# Patient Record
Sex: Male | Born: 1940 | Race: Black or African American | Hispanic: No | Smoking: Former smoker
Health system: Southern US, Community
[De-identification: ages and names within clinical notes are randomized; demographics above are authoritative.]

## PROBLEM LIST (undated history)

## (undated) DIAGNOSIS — E785 Hyperlipidemia, unspecified: Secondary | ICD-10-CM

## (undated) HISTORY — PX: TRANSURETHRAL RESECTION OF PROSTATE: SHX73

---

## 2000-04-28 ENCOUNTER — Inpatient Hospital Stay (HOSPITAL_COMMUNITY): Admission: EM | Admit: 2000-04-28 | Discharge: 2000-04-29 | Payer: Self-pay | Admitting: Cardiology

## 2016-05-22 ENCOUNTER — Emergency Department (HOSPITAL_COMMUNITY): Payer: Medicare FFS

## 2016-05-22 ENCOUNTER — Emergency Department (HOSPITAL_COMMUNITY)
Admission: EM | Admit: 2016-05-22 | Discharge: 2016-05-22 | Disposition: A | Discharge status: Home / Self Care | Payer: Medicare FFS | Attending: Emergency Medicine | Admitting: Emergency Medicine

## 2016-05-22 ENCOUNTER — Encounter (HOSPITAL_COMMUNITY): Payer: Self-pay | Admitting: *Deleted

## 2016-05-22 DIAGNOSIS — R319 Hematuria, unspecified: Secondary | ICD-10-CM

## 2016-05-22 DIAGNOSIS — Z7982 Long term (current) use of aspirin: Secondary | ICD-10-CM | POA: Insufficient documentation

## 2016-05-22 DIAGNOSIS — N3001 Acute cystitis with hematuria: Secondary | ICD-10-CM | POA: Diagnosis not present

## 2016-05-22 DIAGNOSIS — Z87891 Personal history of nicotine dependence: Secondary | ICD-10-CM | POA: Insufficient documentation

## 2016-05-22 DIAGNOSIS — N3289 Other specified disorders of bladder: Secondary | ICD-10-CM | POA: Diagnosis not present

## 2016-05-22 DIAGNOSIS — R300 Dysuria: Secondary | ICD-10-CM | POA: Diagnosis present

## 2016-05-22 HISTORY — DX: Hyperlipidemia, unspecified: E78.5

## 2016-05-22 LAB — URINALYSIS, ROUTINE W REFLEX MICROSCOPIC
BILIRUBIN URINE: NEGATIVE
GLUCOSE, UA: NEGATIVE mg/dL
Ketones, ur: NEGATIVE mg/dL
Leukocytes, UA: NEGATIVE
Nitrite: NEGATIVE
PROTEIN: 100 mg/dL — AB
Specific Gravity, Urine: 1.025 (ref 1.005–1.030)
pH: 6 (ref 5.0–8.0)

## 2016-05-22 LAB — URINE MICROSCOPIC-ADD ON

## 2016-05-22 MED ORDER — LIDOCAINE HCL (PF) 1 % IJ SOLN
INTRAMUSCULAR | Status: AC
  Filled 2016-05-22: qty 5

## 2016-05-22 MED ORDER — CEFTRIAXONE SODIUM 1 G IJ SOLR
1.0000 g | Freq: Once | INTRAMUSCULAR | Status: AC
  Administered 2016-05-22: 1 g via INTRAMUSCULAR
  Filled 2016-05-22: qty 10

## 2016-05-22 MED ORDER — CEPHALEXIN 500 MG PO CAPS: 500.0000 mg | ORAL_CAPSULE | Freq: Four times a day (QID) | ORAL | 0 refills | Status: DC

## 2016-05-22 NOTE — ED Notes (Signed)
Instructed pt to take all of antibiotics as prescribed. 

## 2016-05-22 NOTE — Discharge Instructions (Signed)
Take the prescription as directed.  Call your regular Urologist on Monday to schedule a follow up appointment within the next 3 days.  Return to the Emergency Department immediately sooner if worsening.  ° °

## 2016-05-22 NOTE — ED Notes (Signed)
Bambi called from CT and stated that it would be about 45 minutes before CT could be done.  Will inform pt

## 2016-05-22 NOTE — ED Triage Notes (Signed)
Pt comes in with penile bleeding that started yesterday. Pt states he is passing clots at times. He states to urinate is painful, denies any malodorous urine.

## 2016-05-22 NOTE — ED Notes (Signed)
Gross hematuria noted.

## 2016-05-22 NOTE — ED Provider Notes (Signed)
[facility] Provider Note   CSN: [id number] Arrival date & time: 05/22/16  0955     History   Chief Complaint Chief Complaint  Patient presents with  . Hematuria  . Dysuria    HPI Carlos Mccann is a 75 y.o. male.  HPI  Pt was seen at 1020.  Per pt, c/o gradual onset and persistence of constant dysuria 2 days. Has been associated with hematuria "with clots" since yesterday.  Denies flank pain, no fevers, no abd pain, no N/V/D, no testicular pain/swelling, no rash.   Past Medical History:  Diagnosis Date  . Hyperlipemia     There are no active problems to display for this patient.   Past Surgical History:  Procedure Laterality Date  . TRANSURETHRAL RESECTION OF PROSTATE         Home Medications    Prior to Admission medications   Medication Sig Start Date End Date Taking? Authorizing Provider  aspirin EC 81 MG tablet Take 81 mg by mouth daily.   Yes Historical Provider, MD    Family History No family history on file.  Social History Social History  Substance Use Topics  . Smoking status: Former Games developer  . Smokeless tobacco: Never Used  . Alcohol use No     Allergies   Review of patient's allergies indicates no known allergies.   Review of Systems Review of Systems ROS: Statement: All systems negative except as marked or noted in the HPI; Constitutional: Negative for fever and chills. ; ; Eyes: Negative for eye pain, redness and discharge. ; ; ENMT: Negative for ear pain, hoarseness, nasal congestion, sinus pressure and sore throat. ; ; Cardiovascular: Negative for chest pain, palpitations, diaphoresis, dyspnea and peripheral edema. ; ; Respiratory: Negative for cough, wheezing and stridor. ; ; Gastrointestinal: Negative for nausea, vomiting, diarrhea, abdominal pain, blood in stool, hematemesis, jaundice and rectal bleeding. . ; ; Genitourinary: Negative for flank pain and +dysuria, +hematuria. ; ; Genital:  No penile drainage or rash, no  testicular pain or swelling, no scrotal rash or swelling. ;; Musculoskeletal: Negative for back pain and neck pain. Negative for swelling and trauma.; ; Skin: Negative for pruritus, rash, abrasions, blisters, bruising and skin lesion.; ; Neuro: Negative for headache, lightheadedness and neck stiffness. Negative for weakness, altered level of consciousness, altered mental status, extremity weakness, paresthesias, involuntary movement, seizure and syncope.       Physical Exam Updated Vital Signs BP (!) 140/107 (BP Location: Right Arm)   Pulse 78   Temp 98.1 F (36.7 C) (Oral)   Resp 16   Ht 5\' 8"  (1.727 m)   Wt 158 lb (71.7 kg)   SpO2 100%   BMI 24.02 kg/m   Physical Exam 1025: Physical examination:  Nursing notes reviewed; Vital signs and O2 SAT reviewed;  Constitutional: Well developed, Well nourished, Well hydrated, In no acute distress; Head:  Normocephalic, atraumatic; Eyes: EOMI, PERRL, No scleral icterus; ENMT: Mouth and pharynx normal, Mucous membranes moist; Neck: Supple, Full range of motion, No lymphadenopathy; Cardiovascular: Regular rate and rhythm, No gallop; Respiratory: Breath sounds clear & equal bilaterally, No wheezes.  Speaking full sentences with ease, Normal respiratory effort/excursion; Chest: Nontender, Movement normal; Abdomen: Soft, Nontender, Nondistended, Normal bowel sounds; Genitourinary: No CVA tenderness; Extremities: Pulses normal, No tenderness, No edema, No calf edema or asymmetry.; Neuro: AA&Ox3, Major CN grossly intact.  Speech clear. No gross focal motor or sensory deficits in extremities.; Skin: Color normal, Warm, Dry.   ED Treatments / Results  Labs (all labs ordered are listed, but only abnormal results are displayed)   EKG  EKG Interpretation None       Radiology   Procedures Procedures (including critical care time)  Medications Ordered in ED Medications - No data to display   Initial Impression / Assessment and Plan / ED Course    I have reviewed the triage vital signs and the nursing notes.  Pertinent labs & imaging results that were available during my care of the patient were reviewed by me and considered in my medical decision making (see chart for details).  MDM Reviewed: previous chart, nursing note and vitals Interpretation: labs and CT scan   Results for orders placed or performed during the hospital encounter of 05/22/16  Urinalysis, Routine w reflex microscopic ([facility])  Result Value Ref Range   Color, Urine RED (A) YELLOW   APPearance TURBID (A) CLEAR   Specific Gravity, Urine 1.025 1.005 - 1.030   pH 6.0 5.0 - 8.0   Glucose, UA NEGATIVE NEGATIVE mg/dL   Hgb urine dipstick LARGE (A) NEGATIVE   Bilirubin Urine NEGATIVE NEGATIVE   Ketones, ur NEGATIVE NEGATIVE mg/dL   Protein, ur 409100 (A) NEGATIVE mg/dL   Nitrite NEGATIVE NEGATIVE   Leukocytes, UA NEGATIVE NEGATIVE  Urine microscopic-add on  Result Value Ref Range   Squamous Epithelial / LPF 0-5 (A) NONE SEEN   WBC, UA 6-30 0 - 5 WBC/hpf   RBC / HPF TOO NUMEROUS TO COUNT 0 - 5 RBC/hpf   Bacteria, UA FEW (A) NONE SEEN   Urine-Other URINALYSIS PERFORMED ON SUPERNATANT    Ct Renal Stone Study Result Date: 05/22/2016 CLINICAL DATA:  Peanuts bleeding starting yesterday EXAM: CT ABDOMEN AND PELVIS WITHOUT CONTRAST TECHNIQUE: Multidetector CT imaging of the abdomen and pelvis was performed following the standard protocol without IV contrast. COMPARISON:  None. FINDINGS: Lower chest:  The lung bases are unremarkable.  Small hiatal hernia. Hepatobiliary: Unenhanced liver shows no biliary ductal dilatation. No calcified gallstones are noted within gallbladder. Pancreas: Unenhanced pancreas is unremarkable. Spleen: Unenhanced spleen is unremarkable. Adrenals/Urinary Tract: Unenhanced kidneys shows no nephrolithiasis. There is a probable cyst midpole of the right kidney anteriorly measures 8 mm. Probable cyst in lower pole of the left kidney measures 1.5 cm.  This cannot be characterized without IV contrast. No hydronephrosis or hydroureter. No calcified ureteral calculi. There is mild thickening of anterior wall of urinary bladder. Mild cystitis cannot be excluded. Stomach/Bowel: No small bowel obstruction. No thickened or dilated small bowel loops. Moderate stool noted in right colon and transverse colon. There is no pericecal inflammation. Normal appendix. Some colonic stool noted in sigmoid colon and rectum. No distal colitis or diverticulitis. Vascular/Lymphatic: No aortic aneurysm. Minimal atherosclerotic calcifications are noted proximal iliac arteries. Reproductive: There is significant enlarged prostate gland with indentation of urinary bladder base. Prostate gland measures at least 6.6 by 5.7 cm. Correlation with urology exam is recommended. Other: No ascites or free abdominal air.  No inguinal adenopathy. Musculoskeletal: Sagittal images of the spine shows degenerative changes lumbar spine. Significant disc space flattening with vacuum disc phenomenon and endplate sclerotic changes with mild anterior and mild posterior spurring at L5-S1 level. IMPRESSION: 1. There is no nephrolithiasis. Question bilateral renal cysts. No calcified ureteral calculi. No hydronephrosis or hydroureter. 2. No small bowel or colonic obstruction. 3. Degenerative changes lumbar spine. 4. Significant enlarged prostate gland with indentation of urinary bladder base. Further correlation with urology exam is recommended. 5. Mild thickening of anterior  wall of urinary bladder. Mild cystitis cannot be excluded. Clinical correlation is necessary. 6. Moderate stool noted within distal sigmoid colon and rectum. No evidence of colitis or diverticulitis. Electronically Signed   By: Natasha MeadLiviu  Pop M.D.   On: 05/22/2016 12:57    1300:  +UTI, UC pending. Will dose IM rocephin while in the ED, rx keflex. Pt wants to go home now. Dx and testing d/w pt.  Questions answered.  Verb understanding,  agreeable to d/c home with outpt f/u.    Final Clinical Impressions(s) / ED Diagnoses   Final diagnoses:  Hematuria    New Prescriptions New Prescriptions   No medications on file     Samuel JesterKathleen Alley Neils, DO 05/25/16 1505

## 2016-05-24 LAB — URINE CULTURE: Culture: NO GROWTH

## 2016-06-06 ENCOUNTER — Encounter (HOSPITAL_COMMUNITY): Payer: Self-pay | Admitting: *Deleted

## 2016-06-06 ENCOUNTER — Emergency Department (HOSPITAL_COMMUNITY)
Admission: EM | Admit: 2016-06-06 | Discharge: 2016-06-06 | Disposition: A | Discharge status: Home / Self Care | Payer: Medicare FFS | Attending: Emergency Medicine | Admitting: Emergency Medicine

## 2016-06-06 DIAGNOSIS — M25511 Pain in right shoulder: Secondary | ICD-10-CM | POA: Insufficient documentation

## 2016-06-06 DIAGNOSIS — Z7982 Long term (current) use of aspirin: Secondary | ICD-10-CM | POA: Insufficient documentation

## 2016-06-06 DIAGNOSIS — Z79899 Other long term (current) drug therapy: Secondary | ICD-10-CM | POA: Insufficient documentation

## 2016-06-06 DIAGNOSIS — M25512 Pain in left shoulder: Secondary | ICD-10-CM | POA: Diagnosis not present

## 2016-06-06 DIAGNOSIS — Z87891 Personal history of nicotine dependence: Secondary | ICD-10-CM | POA: Insufficient documentation

## 2016-06-06 MED ORDER — KETOROLAC TROMETHAMINE 60 MG/2ML IM SOLN
20.0000 mg | Freq: Once | INTRAMUSCULAR | Status: AC
  Administered 2016-06-06: 20 mg via INTRAMUSCULAR
  Filled 2016-06-06: qty 2

## 2016-06-06 MED ORDER — METHOCARBAMOL 500 MG PO TABS
500.0000 mg | ORAL_TABLET | Freq: Once | ORAL | Status: AC
  Administered 2016-06-06: 500 mg via ORAL
  Filled 2016-06-06: qty 1

## 2016-06-06 MED ORDER — METHOCARBAMOL 500 MG PO TABS: 500.0000 mg | ORAL_TABLET | Freq: Two times a day (BID) | ORAL | 0 refills | Status: DC | PRN

## 2016-06-06 NOTE — ED Triage Notes (Signed)
Pt c/o left shoulder pain that woke him up from sleep, states that he may have injured the shoulder when he was helping his wife, denies any sob, n/v.

## 2016-06-06 NOTE — ED Provider Notes (Signed)
[facility] Provider Note   CSN: [id number] Arrival date & time: 06/06/16  0154     History   Chief Complaint Chief Complaint  Patient presents with  . Shoulder Pain    HPI Carlos Mccann is a 75 y.o. male.   Shoulder Pain   This is a recurrent problem. The current episode started 6 to 12 hours ago. The problem occurs constantly. The problem has been gradually improving. The pain is present in the neck, right shoulder and left shoulder. The quality of the pain is described as aching. The pain is at a severity of 5/10. The pain is moderate. Associated symptoms include stiffness and tingling. Pertinent negatives include no numbness and full range of motion. Exacerbated by: movement. He has tried nothing for the symptoms. The treatment provided no relief. There has been no history of extremity trauma.    Past Medical History:  Diagnosis Date  . Hyperlipemia     There are no active problems to display for this patient.   Past Surgical History:  Procedure Laterality Date  . TRANSURETHRAL RESECTION OF PROSTATE         Home Medications    Prior to Admission medications   Medication Sig Start Date End Date Taking? Authorizing Provider  aspirin EC 81 MG tablet Take 81 mg by mouth daily.   Yes Historical Provider, MD  cephALEXin (KEFLEX) 500 MG capsule Take 1 capsule (500 mg total) by mouth 4 (four) times daily. 05/22/16  Yes Samuel Jester, DO  simvastatin (ZOCOR) 40 MG tablet Take 40 mg by mouth daily.   Yes Historical Provider, MD  methocarbamol (ROBAXIN) 500 MG tablet Take 1 tablet (500 mg total) by mouth every 12 (twelve) hours as needed for muscle spasms. 06/06/16   Marily Memos, MD    Family History No family history on file.  Social History Social History  Substance Use Topics  . Smoking status: Former Games developer  . Smokeless tobacco: Never Used  . Alcohol use No     Allergies   Review of patient's allergies indicates no known allergies.   Review of  Systems Review of Systems  Musculoskeletal: Positive for neck pain (and bilateral shoulder pain) and stiffness.  Neurological: Positive for tingling. Negative for numbness.  All other systems reviewed and are negative.    Physical Exam Updated Vital Signs BP 148/94   Pulse (!) 58   Temp 97.9 F (36.6 C) (Oral)   Resp 12   Ht 5\' 8"  (1.727 m)   Wt 165 lb (74.8 kg)   SpO2 96%   BMI 25.09 kg/m   Physical Exam  Constitutional: He is oriented to person, place, and time. He appears well-developed and well-nourished.  HENT:  Head: Normocephalic and atraumatic.  Eyes: Conjunctivae are normal.  Neck: Neck supple.  Cardiovascular: Normal rate and regular rhythm.   No murmur heard. Pulmonary/Chest: Effort normal and breath sounds normal. No respiratory distress.  Abdominal: Soft. There is no tenderness.  Musculoskeletal: He exhibits tenderness (and spasms to trapezius bilaterally). He exhibits no edema.  Neurological: He is alert and oriented to person, place, and time.  Skin: Skin is warm and dry.  Psychiatric: He has a normal mood and affect.  Nursing note and vitals reviewed.    ED Treatments / Results  Labs (all labs ordered are listed, but only abnormal results are displayed) Labs Reviewed - No data to display  EKG  EKG Interpretation  Date/Time:  Sunday June 06 2016 02:28:05 EDT Ventricular Rate:  74 PR Interval:    QRS Duration: 84 QT Interval:  391 QTC Calculation: 434 R Axis:   46 Text Interpretation:  Sinus rhythm Borderline T wave abnormalities Baseline wander in lead(s) V6 No old tracing to compare Confirmed by [facility] MD, Barbara CowerJASON [phone]([id number]) on 06/06/2016 2:32:19 AM       Radiology No results found.  Procedures Procedures (including critical care time)  Medications Ordered in ED Medications  ketorolac (TORADOL) injection 20 mg (20 mg Intramuscular Given 06/06/16 0229)  methocarbamol (ROBAXIN) tablet 500 mg (500 mg Oral Given 06/06/16 0229)     Initial  Impression / Assessment and Plan / ED Course  I have reviewed the triage vital signs and the nursing notes.  Pertinent labs & imaging results that were available during my care of the patient were reviewed by me and considered in my medical decision making (see chart for details).  Clinical Course    Likely MSK, ecg ok, doubt ACS. Symptoms improved with toradol and robaxin. Will dc on nsaid and robaxin at home.   Final Clinical Impressions(s) / ED Diagnoses   Final diagnoses:  Bilateral shoulder pain    New Prescriptions Discharge Medication List as of 06/06/2016  3:43 AM    START taking these medications   Details  methocarbamol (ROBAXIN) 500 MG tablet Take 1 tablet (500 mg total) by mouth every 12 (twelve) hours as needed for muscle spasms., Starting Sun 06/06/2016, Print         Marily MemosJason Ethlyn Alto, MD 06/06/16 [phone]

## 2017-01-01 ENCOUNTER — Emergency Department (HOSPITAL_COMMUNITY): Payer: Medicare FFS

## 2017-01-01 ENCOUNTER — Emergency Department (HOSPITAL_COMMUNITY)
Admission: EM | Admit: 2017-01-01 | Discharge: 2017-01-01 | Disposition: A | Discharge status: Home / Self Care | Payer: Medicare FFS | Attending: Emergency Medicine | Admitting: Emergency Medicine

## 2017-01-01 ENCOUNTER — Encounter (HOSPITAL_COMMUNITY): Payer: Self-pay | Admitting: *Deleted

## 2017-01-01 DIAGNOSIS — R319 Hematuria, unspecified: Secondary | ICD-10-CM

## 2017-01-01 DIAGNOSIS — Z7982 Long term (current) use of aspirin: Secondary | ICD-10-CM | POA: Insufficient documentation

## 2017-01-01 DIAGNOSIS — Z87891 Personal history of nicotine dependence: Secondary | ICD-10-CM | POA: Diagnosis not present

## 2017-01-01 DIAGNOSIS — Z79899 Other long term (current) drug therapy: Secondary | ICD-10-CM | POA: Diagnosis not present

## 2017-01-01 DIAGNOSIS — N400 Enlarged prostate without lower urinary tract symptoms: Secondary | ICD-10-CM

## 2017-01-01 DIAGNOSIS — N401 Enlarged prostate with lower urinary tract symptoms: Secondary | ICD-10-CM | POA: Diagnosis not present

## 2017-01-01 DIAGNOSIS — N390 Urinary tract infection, site not specified: Secondary | ICD-10-CM

## 2017-01-01 LAB — CBC WITH DIFFERENTIAL/PLATELET
BASOS ABS: 0 10*3/uL (ref 0.0–0.1)
Basophils Relative: 1 %
Eosinophils Absolute: 0.3 10*3/uL (ref 0.0–0.7)
Eosinophils Relative: 7 %
HEMATOCRIT: 41.4 % (ref 39.0–52.0)
Hemoglobin: 14.2 g/dL (ref 13.0–17.0)
LYMPHS PCT: 42 %
Lymphs Abs: 1.6 10*3/uL (ref 0.7–4.0)
MCH: 29 pg (ref 26.0–34.0)
MCHC: 34.3 g/dL (ref 30.0–36.0)
MCV: 84.5 fL (ref 78.0–100.0)
MONO ABS: 0.3 10*3/uL (ref 0.1–1.0)
Monocytes Relative: 9 %
NEUTROS ABS: 1.6 10*3/uL — AB (ref 1.7–7.7)
Neutrophils Relative %: 41 %
Platelets: 177 10*3/uL (ref 150–400)
RBC: 4.9 MIL/uL (ref 4.22–5.81)
RDW: 13.5 % (ref 11.5–15.5)
WBC: 3.8 10*3/uL — AB (ref 4.0–10.5)

## 2017-01-01 LAB — COMPREHENSIVE METABOLIC PANEL
ALBUMIN: 4.2 g/dL (ref 3.5–5.0)
ALT: 18 U/L (ref 17–63)
AST: 25 U/L (ref 15–41)
Alkaline Phosphatase: 51 U/L (ref 38–126)
Anion gap: 6 (ref 5–15)
BILIRUBIN TOTAL: 0.6 mg/dL (ref 0.3–1.2)
BUN: 16 mg/dL (ref 6–20)
CO2: 28 mmol/L (ref 22–32)
Calcium: 9.4 mg/dL (ref 8.9–10.3)
Chloride: 103 mmol/L (ref 101–111)
Creatinine, Ser: 1.06 mg/dL (ref 0.61–1.24)
GFR calc Af Amer: >60 mL/min (ref >60)
GFR calc non Af Amer: >60 mL/min (ref >60)
GLUCOSE: 126 mg/dL — AB (ref 65–99)
Potassium: 3.6 mmol/L (ref 3.5–5.1)
Sodium: 137 mmol/L (ref 135–145)
TOTAL PROTEIN: 7.8 g/dL (ref 6.5–8.1)

## 2017-01-01 LAB — URINALYSIS, ROUTINE W REFLEX MICROSCOPIC

## 2017-01-01 LAB — URINALYSIS, MICROSCOPIC (REFLEX)

## 2017-01-01 MED ORDER — CEPHALEXIN 500 MG PO CAPS: 500.0000 mg | ORAL_CAPSULE | Freq: Four times a day (QID) | ORAL | 0 refills | Status: DC

## 2017-01-01 MED ORDER — DEXTROSE 5 % IV SOLN
1.0000 g | Freq: Once | INTRAVENOUS | Status: AC
  Administered 2017-01-01: 1 g via INTRAVENOUS
  Filled 2017-01-01: qty 10

## 2017-01-01 MED ORDER — IOPAMIDOL (ISOVUE-300) INJECTION 61%
100.0000 mL | Freq: Once | INTRAVENOUS | Status: AC | PRN
  Administered 2017-01-01: 100 mL via INTRAVENOUS

## 2017-01-01 MED ORDER — SODIUM CHLORIDE 0.9 % IV BOLUS (SEPSIS)
1000.0000 mL | Freq: Once | INTRAVENOUS | Status: AC
  Administered 2017-01-01: 1000 mL via INTRAVENOUS

## 2017-01-01 NOTE — Discharge Instructions (Signed)
You have a urinary infection which is also causing bleeding. Additionally, you have an enlarged prostate. You must see a urologist.  This is very important. Phone number given. Start prescription for antibiotic in the morning. Increase fluids.

## 2017-01-01 NOTE — ED Triage Notes (Addendum)
Pt c/o blood in his urine, dysuria, urinary frequency x 1.5 days. Denies fever, flank pain. Pt reports hx of same with diagnosis of UTI.

## 2017-01-01 NOTE — ED Provider Notes (Signed)
[facility] Provider Note   CSN: [id number] Arrival date & time: 01/01/17  1005   By signing my name below, I, Clarisse Gouge, attest that this documentation has been prepared under the direction and in the presence of Donnetta Hutching, MD. Electronically signed, Clarisse Gouge, ED Scribe. 01/01/17. 2:56 PM.  History   Chief Complaint Chief Complaint  Patient presents with  . Hematuria   HPI  HPI Comments: Carlos Mccann is a 76 y.o. male who presents to the Emergency Department complaining of recurrent hematuria that has flared up x 2 days. Hx of "TURP 6-7 years ago". He notes associated occasional flank pain and dysuria. Pt denies appetite change, weight change, fever, chills and Hx of cancer.  Past Medical History:  Diagnosis Date  . Hyperlipemia     There are no active problems to display for this patient.   Past Surgical History:  Procedure Laterality Date  . TRANSURETHRAL RESECTION OF PROSTATE         Home Medications    Prior to Admission medications   Medication Sig Start Date End Date Taking? Authorizing Provider  aspirin EC 81 MG tablet Take 81 mg by mouth daily.   Yes Historical Provider, MD  simvastatin (ZOCOR) 40 MG tablet Take 40 mg by mouth daily.   Yes Historical Provider, MD  cephALEXin (KEFLEX) 500 MG capsule Take 1 capsule (500 mg total) by mouth 4 (four) times daily. 01/01/17   Donnetta Hutching, MD  methocarbamol (ROBAXIN) 500 MG tablet Take 1 tablet (500 mg total) by mouth every 12 (twelve) hours as needed for muscle spasms. Patient not taking: Reported on 01/01/2017 06/06/16   Marily Memos, MD    Family History No family history on file.  Social History Social History  Substance Use Topics  . Smoking status: Former Games developer  . Smokeless tobacco: Never Used  . Alcohol use No     Allergies   Patient has no known allergies.   Review of Systems Review of Systems  Constitutional: Negative for appetite change, chills, fever and unexpected weight  change.  Gastrointestinal: Negative for abdominal pain.  Genitourinary: Positive for dysuria, flank pain and hematuria.  All other systems reviewed and are negative.    Physical Exam Updated Vital Signs BP 114/81   Pulse 66   Temp 97.9 F (36.6 C) (Oral)   Resp 15   Ht  (1.727 m)   Wt 165 lb (74.8 kg)   SpO2 97%   BMI 25.09 kg/m   Physical Exam  Constitutional: He is oriented to person, place, and time. He appears well-developed and well-nourished.  HENT:  Head: Normocephalic and atraumatic.  Eyes: Conjunctivae are normal.  Neck: Neck supple.  Cardiovascular: Normal rate and regular rhythm.   Pulmonary/Chest: Effort normal and breath sounds normal.  Abdominal: Soft. Bowel sounds are normal. There is CVA tenderness (L).  Minimal tenderness to L flank  Musculoskeletal: Normal range of motion.  Neurological: He is alert and oriented to person, place, and time.  Skin: Skin is warm and dry.  Psychiatric: He has a normal mood and affect. His behavior is normal.  Nursing note and vitals reviewed.    ED Treatments / Results  DIAGNOSTIC STUDIES: Oxygen Saturation is 100% on RA, normal by my interpretation.    COORDINATION OF CARE: 11:53 AM Discussed treatment plan with pt at bedside and pt agreed to plan. Will order blood work, labs and imaging.  Labs (all labs ordered are listed, but only abnormal results are displayed)  Labs Reviewed  URINALYSIS, ROUTINE W REFLEX MICROSCOPIC - Abnormal; Notable for the following:       Result Value   Color, Urine RED (*)    APPearance TURBID (*)    Glucose, UA   (*)    Value: TEST NOT REPORTED DUE TO COLOR INTERFERENCE OF URINE PIGMENT   Hgb urine dipstick   (*)    Value: TEST NOT REPORTED DUE TO COLOR INTERFERENCE OF URINE PIGMENT   Bilirubin Urine   (*)    Value: TEST NOT REPORTED DUE TO COLOR INTERFERENCE OF URINE PIGMENT   Ketones, ur   (*)    Value: TEST NOT REPORTED DUE TO COLOR INTERFERENCE OF URINE PIGMENT   Protein, ur    (*)    Value: TEST NOT REPORTED DUE TO COLOR INTERFERENCE OF URINE PIGMENT   Nitrite   (*)    Value: TEST NOT REPORTED DUE TO COLOR INTERFERENCE OF URINE PIGMENT   Leukocytes, UA   (*)    Value: TEST NOT REPORTED DUE TO COLOR INTERFERENCE OF URINE PIGMENT   All other components within normal limits  URINALYSIS, MICROSCOPIC (REFLEX) - Abnormal; Notable for the following:    Bacteria, UA MANY (*)    Squamous Epithelial / LPF 6-30 (*)    All other components within normal limits  COMPREHENSIVE METABOLIC PANEL - Abnormal; Notable for the following:    Glucose, Bld 126 (*)    All other components within normal limits  CBC WITH DIFFERENTIAL/PLATELET - Abnormal; Notable for the following:    WBC 3.8 (*)    Neutro Abs 1.6 (*)    All other components within normal limits  URINE CULTURE    EKG  EKG Interpretation None       Radiology Ct Abdomen Pelvis W Contrast  Result Date: 01/01/2017 CLINICAL DATA:  Patient has recurrent hematuria beginning 2 days ago. History of a TURP 6-7 years ago. Occasional flank pain and dysuria. EXAM: CT ABDOMEN AND PELVIS WITH CONTRAST TECHNIQUE: Multidetector CT imaging of the abdomen and pelvis was performed using the standard protocol following bolus administration of intravenous contrast. CONTRAST:  ISOVUE-300 IOPAMIDOL (ISOVUE-300) INJECTION 61% COMPARISON:  05/22/2016 FINDINGS: Lower chest: Clear lung bases.  Heart normal size. Hepatobiliary: No focal liver abnormality is seen. No gallstones, gallbladder wall thickening, or biliary dilatation. Pancreas: Unremarkable. No pancreatic ductal dilatation or surrounding inflammatory changes. Spleen: Normal in size without focal abnormality. Adrenals/Urinary Tract: No adrenal masses. Multiple bilateral nonenhancing low-density renal masses consistent with cysts, largest from the lower pole left kidney measuring 16 mm. No renal stones. No hydronephrosis. Normal ureters. Bladder is elevated by an enlarged  prostate. No bladder mass or stone. Stomach/Bowel: Stomach is within normal limits. Appendix appears normal. No evidence of bowel wall thickening, distention, or inflammatory changes. Vascular/Lymphatic: Minimal atherosclerotic calcification at the aortic bifurcation. No aneurysm. No adenopathy. Reproductive: Prostate gland is heterogeneous and markedly enlarged, measuring 6.6 x 6.0 x 6.3 cm. Prostate elevates the bladder base. Other: No abdominal wall hernia or abnormality. No abdominopelvic ascites. Musculoskeletal: No fracture or acute finding. Disc degenerative changes at L4 -L5. No osteoblastic or osteolytic lesions. IMPRESSION: 1. No acute findings. No renal or ureteral stones or obstructive uropathy. 2. Significant enlargement of the prostate which is also heterogeneous in attenuation. It elevates the bladder base. Recommend correlation with a PSA level. 3. Bilateral renal cysts. Electronically Signed   By: Amie Portland M.D.   On: 01/01/2017 12:53    Procedures Procedures (including critical care time)  Medications Ordered in ED Medications  iopamidol (ISOVUE-300) 61 % injection 100 mL (100 mLs Intravenous Contrast Given 01/01/17 1225)  sodium chloride 0.9 % bolus 1,000 mL (1,000 mLs Intravenous New Bag/Given 01/01/17 1331)  cefTRIAXone (ROCEPHIN) 1 g in dextrose 5 % 50 mL IVPB (0 g Intravenous Stopped 01/01/17 1359)     Initial Impression / Assessment and Plan / ED Course  I have reviewed the triage vital signs and the nursing notes.  Pertinent labs & imaging results that were available during my care of the patient were reviewed by me and considered in my medical decision making (see chart for details).     Patient is hemodynamically stable. Urinalysis shows evidence of infection. CT scan of abdomen/pelvis shows no obvious tumor. His prostate is enlarged. IV Rocephin administered in the ED. Urine culture. Discharge medications Keflex 500 mg. Patient understands to follow-up with urology  for his urinary tract infection and prostatic hypertrophy.  Final Clinical Impressions(s) / ED Diagnoses   Final diagnoses:  Hematuria, unspecified type  Urinary tract infection with hematuria, site unspecified  Benign prostatic hyperplasia, unspecified whether lower urinary tract symptoms present    New Prescriptions New Prescriptions   CEPHALEXIN (KEFLEX) 500 MG CAPSULE    Take 1 capsule (500 mg total) by mouth 4 (four) times daily.   I personally performed the services described in this documentation, which was scribed in my presence. The recorded information has been reviewed and is accurate.     Donnetta Hutching, MD 01/01/17 [id number]

## 2017-01-02 LAB — URINE CULTURE: Culture: <10000 — AB

## 2017-09-23 ENCOUNTER — Encounter (HOSPITAL_COMMUNITY): Payer: Self-pay | Admitting: Emergency Medicine

## 2017-09-23 ENCOUNTER — Other Ambulatory Visit: Payer: Self-pay

## 2017-09-23 ENCOUNTER — Emergency Department (HOSPITAL_COMMUNITY)
Admission: EM | Admit: 2017-09-23 | Discharge: 2017-09-23 | Disposition: A | Discharge status: Home / Self Care | Payer: Medicare FFS | Attending: Emergency Medicine | Admitting: Emergency Medicine

## 2017-09-23 ENCOUNTER — Emergency Department (HOSPITAL_COMMUNITY): Payer: Medicare FFS

## 2017-09-23 DIAGNOSIS — R3129 Other microscopic hematuria: Secondary | ICD-10-CM

## 2017-09-23 DIAGNOSIS — R319 Hematuria, unspecified: Secondary | ICD-10-CM | POA: Insufficient documentation

## 2017-09-23 DIAGNOSIS — Z7982 Long term (current) use of aspirin: Secondary | ICD-10-CM | POA: Diagnosis not present

## 2017-09-23 DIAGNOSIS — N400 Enlarged prostate without lower urinary tract symptoms: Secondary | ICD-10-CM

## 2017-09-23 DIAGNOSIS — Z79899 Other long term (current) drug therapy: Secondary | ICD-10-CM | POA: Insufficient documentation

## 2017-09-23 DIAGNOSIS — N401 Enlarged prostate with lower urinary tract symptoms: Secondary | ICD-10-CM | POA: Diagnosis not present

## 2017-09-23 DIAGNOSIS — Z87891 Personal history of nicotine dependence: Secondary | ICD-10-CM | POA: Diagnosis not present

## 2017-09-23 LAB — URINALYSIS, ROUTINE W REFLEX MICROSCOPIC
Bilirubin Urine: NEGATIVE
Glucose, UA: NEGATIVE mg/dL
Ketones, ur: NEGATIVE mg/dL
Leukocytes, UA: NEGATIVE
Nitrite: NEGATIVE
PROTEIN: 100 mg/dL — AB
Specific Gravity, Urine: 1.024 (ref 1.005–1.030)
pH: 5 (ref 5.0–8.0)

## 2017-09-23 LAB — COMPREHENSIVE METABOLIC PANEL
ALT: 14 U/L — AB (ref 17–63)
AST: 22 U/L (ref 15–41)
Albumin: 4.3 g/dL (ref 3.5–5.0)
Alkaline Phosphatase: 60 U/L (ref 38–126)
Anion gap: 10 (ref 5–15)
BUN: 21 mg/dL — AB (ref 6–20)
CALCIUM: 9.8 mg/dL (ref 8.9–10.3)
CO2: 24 mmol/L (ref 22–32)
Chloride: 106 mmol/L (ref 101–111)
Creatinine, Ser: 1.17 mg/dL (ref 0.61–1.24)
GFR calc non Af Amer: 58 mL/min — ABNORMAL LOW (ref >60)
GLUCOSE: 123 mg/dL — AB (ref 65–99)
POTASSIUM: 4.4 mmol/L (ref 3.5–5.1)
Sodium: 140 mmol/L (ref 135–145)
TOTAL PROTEIN: 8.2 g/dL — AB (ref 6.5–8.1)
Total Bilirubin: 0.8 mg/dL (ref 0.3–1.2)

## 2017-09-23 LAB — CBC WITH DIFFERENTIAL/PLATELET
BASOS PCT: 1 %
Basophils Absolute: 0 10*3/uL (ref 0.0–0.1)
EOS PCT: 7 %
Eosinophils Absolute: 0.3 10*3/uL (ref 0.0–0.7)
HCT: 42.8 % (ref 39.0–52.0)
Hemoglobin: 13.9 g/dL (ref 13.0–17.0)
Lymphocytes Relative: 41 %
Lymphs Abs: 1.6 10*3/uL (ref 0.7–4.0)
MCH: 28 pg (ref 26.0–34.0)
MCHC: 32.5 g/dL (ref 30.0–36.0)
MCV: 86.1 fL (ref 78.0–100.0)
Monocytes Absolute: 0.3 10*3/uL (ref 0.1–1.0)
Monocytes Relative: 8 %
NEUTROS ABS: 1.6 10*3/uL — AB (ref 1.7–7.7)
Neutrophils Relative %: 43 %
PLATELETS: 192 10*3/uL (ref 150–400)
RBC: 4.97 MIL/uL (ref 4.22–5.81)
RDW: 13.7 % (ref 11.5–15.5)
WBC: 3.8 10*3/uL — AB (ref 4.0–10.5)

## 2017-09-23 MED ORDER — TAMSULOSIN HCL 0.4 MG PO CAPS
0.4000 mg | ORAL_CAPSULE | Freq: Every day | ORAL | 0 refills | Status: DC
Start: 2017-09-23 — End: 2020-04-01

## 2017-09-23 NOTE — ED Provider Notes (Signed)
[facility] EMERGENCY DEPARTMENT Provider Note   CSN: [id number] Arrival date & time: 09/23/17  1125     History   Chief Complaint Chief Complaint  Patient presents with  . Hematuria    HPI Carlos Mccann is a 76 y.o. male.  HPI Patient presents with concern hematuria and left flank pain. Onset was possibly 1 week ago, the symptoms have been more pronounced over the past day. He has intermittent left flank pain, that does not coincide necessarily with urination. He has had variable amounts of blood in his urine, though it is painless urinating. No associated fever, chills, scrotal pain, swelling, penis pain, swelling. No history of kidney stones, kidney infection. He does have a history of prior TURP in the distant past. Patient was well prior to the onset of symptoms, denies recent changes in medication, diet, lifestyle. Past Medical History:  Diagnosis Date  . Hyperlipemia     There are no active problems to display for this patient.   Past Surgical History:  Procedure Laterality Date  . TRANSURETHRAL RESECTION OF PROSTATE         Home Medications    Prior to Admission medications   Medication Sig Start Date End Date Taking? Authorizing Provider  aspirin EC 81 MG tablet Take 81 mg by mouth daily.    [provider]  cephALEXin (KEFLEX) 500 MG capsule Take 1 capsule (500 mg total) by mouth 4 (four) times daily. 01/01/17   Donnetta Hutchingook, Brian, MD  methocarbamol (ROBAXIN) 500 MG tablet Take 1 tablet (500 mg total) by mouth every 12 (twelve) hours as needed for muscle spasms. Patient not taking: Reported on 01/01/2017 06/06/16   Mesner, Barbara CowerJason, MD  simvastatin (ZOCOR) 40 MG tablet Take 40 mg by mouth daily.    [provider]    Family History History reviewed. No pertinent family history.  Social History Social History   Tobacco Use  . Smoking status: Former Games developermoker  . Smokeless tobacco: Never Used  Substance Use Topics  . Alcohol use: No  . Drug  use: No     Allergies   Patient has no known allergies.   Review of Systems Review of Systems  Constitutional:       Per HPI, otherwise negative  HENT:       Per HPI, otherwise negative  Respiratory:       Per HPI, otherwise negative  Cardiovascular:       Per HPI, otherwise negative  Gastrointestinal: Negative for vomiting.  Endocrine:       Negative aside from HPI  Genitourinary:       Neg aside from HPI   Musculoskeletal:       Per HPI, otherwise negative  Skin: Negative.   Neurological: Negative for syncope.     Physical Exam Updated Vital Signs BP (!) 119/94 (BP Location: Right Arm)   Pulse 76   Temp 98.5 F (36.9 C) (Oral)   Resp 16   Ht 5\' 8"  (1.727 m)   Wt 71.2 kg (157 lb)   SpO2 98%   BMI 23.87 kg/m   Physical Exam  Constitutional: He is oriented to person, place, and time. He appears well-developed. No distress.  HENT:  Head: Normocephalic and atraumatic.  Eyes: Conjunctivae and EOM are normal.  Cardiovascular: Normal rate and regular rhythm.  Pulmonary/Chest: Effort normal. No stridor. No respiratory distress.  Abdominal: He exhibits no distension.  No tenderness palpation, no guarding, no rebound.  No left flank tenderness to palpation.  Musculoskeletal: He exhibits no edema.  Neurological: He is alert and oriented to person, place, and time.  Skin: Skin is warm and dry.  Psychiatric: He has a normal mood and affect.  Nursing note and vitals reviewed.    ED Treatments / Results  Labs (all labs ordered are listed, but only abnormal results are displayed) Labs Reviewed  URINALYSIS, ROUTINE W REFLEX MICROSCOPIC - Abnormal; Notable for the following components:      Result Value   Color, Urine AMBER (*)    APPearance CLOUDY (*)    Hgb urine dipstick LARGE (*)    Protein, ur 100 (*)    Bacteria, UA RARE (*)    Squamous Epithelial / LPF 0-5 (*)    All other components within normal limits  COMPREHENSIVE METABOLIC PANEL - Abnormal; Notable  for the following components:   Glucose, Bld 123 (*)    BUN 21 (*)    Total Protein 8.2 (*)    ALT 14 (*)    GFR calc non Af Amer 58 (*)    All other components within normal limits  CBC WITH DIFFERENTIAL/PLATELET - Abnormal; Notable for the following components:   WBC 3.8 (*)    Neutro Abs 1.6 (*)    All other components within normal limits    EKG  EKG Interpretation None       Radiology Ct Renal Stone Study  Result Date: 09/23/2017 CLINICAL DATA:  Blood in urine EXAM: CT ABDOMEN AND PELVIS WITHOUT CONTRAST TECHNIQUE: Multidetector CT imaging of the abdomen and pelvis was performed following the standard protocol without IV contrast. COMPARISON:  01/01/2017 FINDINGS: Lower chest: Lung bases demonstrate no acute consolidation or effusion. Normal heart size. Hepatobiliary: No focal liver abnormality is seen. No gallstones, gallbladder wall thickening, or biliary dilatation. Pancreas: Unremarkable. No pancreatic ductal dilatation or surrounding inflammatory changes. Spleen: Normal in size without focal abnormality. Adrenals/Urinary Tract: Adrenal glands are within normal limits. Negative for hydronephrosis or ureteral stone. Hypodense renal lesions, corresponding to cysts seen on previous exam. Bladder does not appear significantly thick walled. Minimal tenting of the anterior superior bladder which is contiguous with linear tract extending to the umbilicus but no fluid along the tract to suggest urachal abnormality. Stomach/Bowel: Stomach is within normal limits. Appendix appears normal. No evidence of bowel wall thickening, distention, or inflammatory changes. Vascular/Lymphatic: Aortic atherosclerosis. No enlarged abdominal or pelvic lymph nodes. Reproductive: Significantly enlarged prostate gland with mass effect on the posterior bladder Other: Negative for free air or free fluid. Small fat in the umbilicus Musculoskeletal: Degenerative changes of the spine. No acute or suspicious lesion.  IMPRESSION: 1. Negative for nephrolithiasis, hydronephrosis or ureteral stone. Hypodense renal lesions consistent with cysts demonstrated on the previous CT 2. Massive enlargement of the prostate with mass effect on the posterior aspect of the bladder. Urologic consultation previously recommended. Electronically Signed   By: Jasmine Pang M.D.   On: 09/23/2017 14:19    Procedures Procedures (including critical care time)  Medications Ordered in ED Medications - No data to display   Initial Impression / Assessment and Plan / ED Course  I have reviewed the triage vital signs and the nursing notes.  Pertinent labs & imaging results that were available during my care of the patient were reviewed by me and considered in my medical decision making (see chart for details).  3:28 PM On repeat exam the patient is in no distress, is awake, alert. He is now accompanied by a male companion. We discussed  all findings at length including concern for prostatic hypertrophy. Patient will follow up with urology as an outpatient. But with otherwise reassuring findings, no evidence for kidney stone, kidney infection, no other evidence for abdominal mass, and with stable vital signs, the patient is appropriate for close outpatient follow-up.  Final Clinical Impressions(s) / ED Diagnoses  Hematuria Prostate enlargement   Gerhard MunchLockwood, Bernal Luhman, MD 09/23/17 1529

## 2017-09-23 NOTE — Discharge Instructions (Signed)
As discussed, your evaluation today has been largely reassuring.  But, it is important that you monitor your condition carefully, and do not hesitate to return to the ED if you develop new, or concerning changes in your condition.  Hematuria and an enlarged prostate require additional evaluation from urology.  Be sure to call today for next available appointment.

## 2017-09-23 NOTE — ED Triage Notes (Signed)
Pt states blood in urine since yesterday. Denies other symptoms. Has had prior back in January but did not follow up with urologist.

## 2018-02-17 ENCOUNTER — Emergency Department (HOSPITAL_COMMUNITY): Payer: Medicare FFS

## 2018-02-17 ENCOUNTER — Emergency Department (HOSPITAL_COMMUNITY)
Admission: EM | Admit: 2018-02-17 | Discharge: 2018-02-17 | Disposition: A | Discharge status: Home / Self Care | Payer: Medicare FFS | Attending: Emergency Medicine | Admitting: Emergency Medicine

## 2018-02-17 ENCOUNTER — Encounter (HOSPITAL_COMMUNITY): Payer: Self-pay | Admitting: Emergency Medicine

## 2018-02-17 ENCOUNTER — Other Ambulatory Visit: Payer: Self-pay

## 2018-02-17 DIAGNOSIS — R0789 Other chest pain: Secondary | ICD-10-CM | POA: Diagnosis not present

## 2018-02-17 DIAGNOSIS — Z7982 Long term (current) use of aspirin: Secondary | ICD-10-CM | POA: Diagnosis not present

## 2018-02-17 DIAGNOSIS — R319 Hematuria, unspecified: Secondary | ICD-10-CM | POA: Insufficient documentation

## 2018-02-17 DIAGNOSIS — R079 Chest pain, unspecified: Secondary | ICD-10-CM | POA: Diagnosis present

## 2018-02-17 LAB — I-STAT CHEM 8, ED
BUN: 11 mg/dL (ref 6–20)
CALCIUM ION: 1.22 mmol/L (ref 1.15–1.40)
CREATININE: 1.1 mg/dL (ref 0.61–1.24)
Chloride: 103 mmol/L (ref 101–111)
Glucose, Bld: 105 mg/dL — ABNORMAL HIGH (ref 65–99)
HCT: 40 % (ref 39.0–52.0)
HEMOGLOBIN: 13.6 g/dL (ref 13.0–17.0)
POTASSIUM: 3.9 mmol/L (ref 3.5–5.1)
Sodium: 142 mmol/L (ref 135–145)
TCO2: 27 mmol/L (ref 22–32)

## 2018-02-17 LAB — URINALYSIS, ROUTINE W REFLEX MICROSCOPIC
BACTERIA UA: NONE SEEN
BILIRUBIN URINE: NEGATIVE
Glucose, UA: NEGATIVE mg/dL
Ketones, ur: NEGATIVE mg/dL
LEUKOCYTES UA: NEGATIVE
NITRITE: NEGATIVE
PROTEIN: 100 mg/dL — AB
RBC / HPF: >50 RBC/hpf — ABNORMAL HIGH (ref 0–5)
Specific Gravity, Urine: 1.018 (ref 1.005–1.030)
WBC, UA: >50 WBC/hpf — ABNORMAL HIGH (ref 0–5)
pH: 6 (ref 5.0–8.0)

## 2018-02-17 MED ORDER — SIMVASTATIN 40 MG PO TABS: 40.0000 mg | ORAL_TABLET | Freq: Every day | ORAL | 0 refills | Status: DC

## 2018-02-17 NOTE — Discharge Instructions (Addendum)
The discomfort in your left chest wall is likely muscle strain.  Try using heat on it and take Tylenol if needed for pain.  You have blood in the urine, which has been present before.  It is important to follow-up with a urologist because you have had bleeding, without a comprehensive evaluation.  Call Dr. Marlou Porch for appointment to be seen as soon as possible.  See your primary care doctor as needed for problems.

## 2018-02-17 NOTE — ED Notes (Signed)
Return from Xray

## 2018-02-17 NOTE — ED Provider Notes (Signed)
[facility] EMERGENCY DEPARTMENT Provider Note   CSN: [id number] Arrival date & time: 02/17/18  1344     History   Chief Complaint Chief Complaint  Patient presents with  . Flank Pain    HPI Carlos Mccann is a 77 y.o. male.  HPI   He presents for evaluation of left-sided chest pain, intermittently twice in the last 3 days.  No known trauma.  The pain feels like "stinging," in the left anterolateral chest wall.  There is no associated pain with deep breathing, movement or coughing.  He has occasional sporadic cough without sputum production.  He denies nausea, vomiting, fever, chills, shortness of breath, weakness or dizziness.  He denies hematuria, dysuria or urinary frequency.  Sometimes he does some lifting, when he has to help his wife get in and out of the wheelchair.  There are no other known modifying factors.   Past Medical History:  Diagnosis Date  . Hyperlipemia     There are no active problems to display for this patient.   Past Surgical History:  Procedure Laterality Date  . TRANSURETHRAL RESECTION OF PROSTATE          Home Medications    Prior to Admission medications   Medication Sig Start Date End Date Taking? Authorizing Provider  aspirin EC 81 MG tablet Take 81 mg by mouth daily.   Yes [provider]  tamsulosin (FLOMAX) 0.4 MG CAPS capsule Take 1 capsule (0.4 mg total) by mouth daily. 09/23/17  Yes Gerhard Munch, MD  simvastatin (ZOCOR) 40 MG tablet Take 1 tablet (40 mg total) by mouth daily. 02/17/18   Mancel Bale, MD    Family History No family history on file.  Social History Social History   Tobacco Use  . Smoking status: Former Games developer  . Smokeless tobacco: Never Used  Substance Use Topics  . Alcohol use: No  . Drug use: No     Allergies   Patient has no known allergies.   Review of Systems Review of Systems  All other systems reviewed and are negative.    Physical Exam Updated Vital Signs BP 121/77 (BP  Location: Right Arm)   Pulse 70   Temp 97.8 F (36.6 C) (Temporal)   Resp 16   Ht  (1.727 m)   Wt 68 kg (150 lb)   SpO2 99%   BMI 22.81 kg/m   Physical Exam  Constitutional: He is oriented to person, place, and time. He appears well-developed and well-nourished. No distress.  HENT:  Head: Normocephalic and atraumatic.  Right Ear: External ear normal.  Left Ear: External ear normal.  Eyes: Pupils are equal, round, and reactive to light. Conjunctivae and EOM are normal.  Neck: Normal range of motion and phonation normal. Neck supple.  Cardiovascular: Normal rate, regular rhythm and normal heart sounds.  Pulmonary/Chest: Effort normal and breath sounds normal. No stridor. No respiratory distress. He exhibits no tenderness and no bony tenderness.  Abdominal: Soft. There is no tenderness.  Musculoskeletal: Normal range of motion.  Neurological: He is alert and oriented to person, place, and time. No cranial nerve deficit or sensory deficit. He exhibits normal muscle tone. Coordination normal.  Skin: Skin is warm, dry and intact. No rash noted.  No rash of chest.  Psychiatric: He has a normal mood and affect. His behavior is normal. Judgment and thought content normal.  Nursing note and vitals reviewed.    ED Treatments / Results  Labs (all labs ordered are listed,  but only abnormal results are displayed) Labs Reviewed  URINALYSIS, ROUTINE W REFLEX MICROSCOPIC - Abnormal; Notable for the following components:      Result Value   Color, Urine RED (*)    APPearance CLOUDY (*)    Hgb urine dipstick MODERATE (*)    Protein, ur 100 (*)    RBC / HPF >50 (*)    WBC, UA >50 (*)    All other components within normal limits  I-STAT CHEM 8, ED - Abnormal; Notable for the following components:   Glucose, Bld 105 (*)    All other components within normal limits  URINE CULTURE    EKG EKG Interpretation  Date/Time:  Friday Feb 17 2018 14:49:14 EDT Ventricular Rate:  61 PR  Interval:    QRS Duration: 82 QT Interval:  404 QTC Calculation: 407 R Axis:   53 Text Interpretation:  Sinus rhythm Borderline T wave abnormalities since last tracing no significant change Confirmed by Mancel Bale [phone]) on 02/17/2018 2:52:58 PM   Radiology Dg Chest 2 View  Result Date: 02/17/2018 CLINICAL DATA:  LEFT-sided chest pain.  Former smoker. EXAM: CHEST - 2 VIEW COMPARISON:  None. FINDINGS: Heart size and mediastinal contours are within normal limits. Lungs are hyperexpanded. Lungs are clear. No pleural effusion or pneumothorax seen. No acute or suspicious osseous finding. IMPRESSION: 1. No active cardiopulmonary disease. No evidence of pneumonia or pulmonary edema. 2. Hyperexpanded lungs suggesting COPD. Electronically Signed   By: Bary Richard M.D.   On: 02/17/2018 15:44    Procedures Procedures (including critical care time)  Medications Ordered in ED Medications - No data to display   Initial Impression / Assessment and Plan / ED Course  I have reviewed the triage vital signs and the nursing notes.  Pertinent labs & imaging results that were available during my care of the patient were reviewed by me and considered in my medical decision making (see chart for details).  Clinical Course as of Feb 18 1652  Fri Feb 17, 2018  1611 Because of recurrent hematuria on prior exams, I asked the patient if he follow-up with urology.  He stated that he had not, because "my brother saw them and he never got better."   [EW]  1647 He remains comfortable, he now states that he is out of his Zocor.   [EW]  1647 Normal except minimal elevation glucose at 105.  I-stat Chem 8, ED(!) [EW]  1647 Normal except moderate hemoglobin on dipstick, with greater than 50 red and white cells per high-power field.  Also budding yeast is noted.  Urine culture ordered to evaluate for possible infection.  Urinalysis, Routine w reflex microscopic(!) [EW]    Clinical Course User Index [EW] Mancel Bale, MD     Patient Vitals for the past 24 hrs:  BP Temp Temp src Pulse Resp SpO2 Height Weight  02/17/18 1628 121/77 - - 70 16 99 % - -  02/17/18 1352 - - - - - -  (1.727 m) 68 kg (150 lb)  02/17/18 1351 140/88 97.8 F (36.6 C) Temporal 71 16 99 % - -    4:53 PM Reevaluation with update and discussion. After initial assessment and treatment, an updated evaluation reveals he remains comfortable has no further complaints.  Findings discussed with the patient and all questions were answered. Mancel Bale   Medical Decision Making: Nontoxic patient presenting with left chest pain, which he suspects is from muscle strain.  Screening evaluation with urinalysis indicates  blood present, both red and white cells, with yeast.  Urine culture ordered.  Patient has frequently had hematuria on prior evaluations and has not followed up with urology as directed.  He does not have clinical signs or symptoms of urinary tract infection, so will hold off on prescribing antibiotics for culture.  Patient is strongly encouraged to follow-up with urology for ongoing hematuria which likely needs to be further evaluated.  Screening blood work today, indicates normal hemoglobin, and normal renal function.  CRITICAL CARE-no Performed by: Mancel Bale   Nursing Notes Reviewed/ Care Coordinated Applicable Imaging Reviewed Interpretation of Laboratory Data incorporated into ED treatment  The patient appears reasonably screened and/or stabilized for discharge and I doubt any other medical condition or other [facility] requiring further screening, evaluation, or treatment in the ED at this time prior to discharge.  Plan: Home Medications-Tylenol for pain, continue routine medications; Home Treatments-heat to affected area chest.; return here if the recommended treatment, does not improve the symptoms; Recommended follow up-urology follow-up as soon as possible, PCP as needed  \  Final Clinical Impressions(s) / ED  Diagnoses   Final diagnoses:  Chest wall pain  Hematuria, unspecified type    ED Discharge Orders        Ordered    simvastatin (ZOCOR) 40 MG tablet  Daily     02/17/18 1650       Mancel Bale, MD 02/17/18 1653

## 2018-02-17 NOTE — ED Triage Notes (Signed)
L sided flank pain since this am  No call to physician nor OTC meds

## 2018-02-19 LAB — URINE CULTURE: Culture: NO GROWTH

## 2020-04-01 ENCOUNTER — Encounter (INDEPENDENT_AMBULATORY_CARE_PROVIDER_SITE_OTHER): Payer: Self-pay | Admitting: Internal Medicine

## 2020-04-01 ENCOUNTER — Other Ambulatory Visit: Payer: Self-pay

## 2020-04-01 ENCOUNTER — Ambulatory Visit (INDEPENDENT_AMBULATORY_CARE_PROVIDER_SITE_OTHER): Payer: Medicare Other | Admitting: Internal Medicine

## 2020-04-01 VITALS — BP 120/70 | HR 92 | Temp 97.6°F | Ht 68.0 in | Wt 176.8 lb

## 2020-04-01 DIAGNOSIS — E782 Mixed hyperlipidemia: Secondary | ICD-10-CM | POA: Diagnosis not present

## 2020-04-01 DIAGNOSIS — E559 Vitamin D deficiency, unspecified: Secondary | ICD-10-CM | POA: Diagnosis not present

## 2020-04-01 DIAGNOSIS — R5381 Other malaise: Secondary | ICD-10-CM | POA: Diagnosis not present

## 2020-04-01 DIAGNOSIS — R5383 Other fatigue: Secondary | ICD-10-CM | POA: Diagnosis not present

## 2020-04-01 NOTE — Progress Notes (Signed)
Metrics: Intervention Frequency ACO  Documented Smoking Status Yearly  Screened one or more times in 24 months  Cessation Counseling or  Active cessation medication Past 24 months  Past 24 months   Guideline developer: UpToDate (See UpToDate for funding source) Date Released: 2014       Wellness Office Visit  Subjective:  Patient ID: Carlos Mccann, male    DOB: 1941-04-02  Age: 79 y.o. MRN: [id number]  CC: This very pleasant 79 year old man comes to our practice as a new patient to be established. HPI  He has a history of hyperlipidemia and is on statin therapy.  He also takes a baby aspirin although he has never had a history of heart disease, cerebrovascular disease or history of diabetes.  He has no complaints today. He complains of fatigue intermittently. Past Medical History:  Diagnosis Date  . Hyperlipemia    Past Surgical History:  Procedure Laterality Date  . TRANSURETHRAL RESECTION OF PROSTATE       Family History  Problem Relation Age of Onset  . Healthy Mother   . Healthy Father     Social History   Social History Narrative   Widower since April 2021,married for 58 years.Lives alone.Retired.   Social History   Tobacco Use  . Smoking status: Former Games developer  . Smokeless tobacco: Never Used  Substance Use Topics  . Alcohol use: No    Current Meds  Medication Sig  . aspirin EC 81 MG tablet Take 81 mg by mouth daily.  . simvastatin (ZOCOR) 40 MG tablet Take 1 tablet (40 mg total) by mouth daily.      No flowsheet data found.   Objective:   Today's Vitals: BP 120/70 (BP Location: Left Arm, Patient Position: Sitting, Cuff Size: Normal)   Pulse 92   Temp 97.6 F (36.4 C) (Temporal)   Ht 5\' 8"  (1.727 m)   Wt 176 lb 12.8 oz (80.2 kg)   SpO2 96%   BMI 26.88 kg/m  Vitals with BMI 04/01/2020 02/17/2018 02/17/2018  Height 5\' 8"  - -  Weight 176 lbs 13 oz - -  BMI 26.89 - -  Systolic 120 134 02/19/2018  Diastolic 70 83 77  Pulse 92 54 70     Physical  Exam   He looks systemically well.  Blood pressure is excellent.    Assessment   1. Vitamin D deficiency disease   2. Mixed hyperlipidemia   3. Malaise and fatigue       Tests ordered Orders Placed This Encounter  Procedures  . CBC  . COMPLETE METABOLIC PANEL WITH GFR  . T3, free  . T4  . TSH  . Lipid panel  . VITAMIN D 25 Hydroxy (Vit-D Deficiency, Fractures)     Plan: 1. Blood work is ordered. 2. He will continue with all medications and I will see him in about 4 weeks to review the results and make any further recommendations. 3. Overall he appears to be a very healthy 79 year old man.   No orders of the defined types were placed in this encounter.   295, MD

## 2020-04-02 LAB — COMPLETE METABOLIC PANEL WITH GFR
AG Ratio: 1.3 (calc) (ref 1.0–2.5)
ALT: 54 U/L — ABNORMAL HIGH (ref 9–46)
AST: 44 U/L — ABNORMAL HIGH (ref 10–35)
Albumin: 4.2 g/dL (ref 3.6–5.1)
Alkaline phosphatase (APISO): 53 U/L (ref 35–144)
BUN: 13 mg/dL (ref 7–25)
CO2: 27 mmol/L (ref 20–32)
Calcium: 9.8 mg/dL (ref 8.6–10.3)
Chloride: 103 mmol/L (ref 98–110)
Creat: 1.11 mg/dL (ref 0.70–1.11)
GFR, Est African American: 72 mL/min/{1.73_m2} (ref >=60)
GFR, Est Non African American: 62 mL/min/{1.73_m2} (ref >=60)
Globulin: 3.2 g/dL (calc) (ref 1.9–3.7)
Glucose, Bld: 137 mg/dL — ABNORMAL HIGH (ref 65–99)
Potassium: 4.2 mmol/L (ref 3.5–5.3)
Sodium: 139 mmol/L (ref 135–146)
Total Bilirubin: 0.4 mg/dL (ref 0.2–1.2)
Total Protein: 7.4 g/dL (ref 6.1–8.1)

## 2020-04-02 LAB — CBC
HCT: 42 % (ref 38.5–50.0)
Hemoglobin: 13.8 g/dL (ref 13.2–17.1)
MCH: 27.9 pg (ref 27.0–33.0)
MCHC: 32.9 g/dL (ref 32.0–36.0)
MCV: 84.8 fL (ref 80.0–100.0)
MPV: 10.5 fL (ref 7.5–12.5)
Platelets: 202 10*3/uL (ref 140–400)
RBC: 4.95 10*6/uL (ref 4.20–5.80)
RDW: 12.8 % (ref 11.0–15.0)
WBC: 5 10*3/uL (ref 3.8–10.8)

## 2020-04-02 LAB — LIPID PANEL
Cholesterol: 234 mg/dL — ABNORMAL HIGH (ref <200)
HDL: 43 mg/dL (ref >=40)
LDL Cholesterol (Calc): 149 mg/dL (calc) — ABNORMAL HIGH
Non-HDL Cholesterol (Calc): 191 mg/dL (calc) — ABNORMAL HIGH (ref <130)
Total CHOL/HDL Ratio: 5.4 (calc) — ABNORMAL HIGH (ref <5.0)
Triglycerides: 257 mg/dL — ABNORMAL HIGH (ref <150)

## 2020-04-02 LAB — T4: T4, Total: 6.2 ug/dL (ref 4.9–10.5)

## 2020-04-02 LAB — TSH: TSH: 2.63 mIU/L (ref 0.40–4.50)

## 2020-04-02 LAB — T3, FREE: T3, Free: 3.3 pg/mL (ref 2.3–4.2)

## 2020-04-02 LAB — VITAMIN D 25 HYDROXY (VIT D DEFICIENCY, FRACTURES): Vit D, 25-Hydroxy: 27 ng/mL — ABNORMAL LOW (ref 30–100)

## 2020-04-29 ENCOUNTER — Ambulatory Visit (INDEPENDENT_AMBULATORY_CARE_PROVIDER_SITE_OTHER): Payer: Medicare Other | Admitting: Internal Medicine

## 2020-04-29 ENCOUNTER — Encounter (INDEPENDENT_AMBULATORY_CARE_PROVIDER_SITE_OTHER): Payer: Self-pay | Admitting: Internal Medicine

## 2020-04-29 ENCOUNTER — Other Ambulatory Visit: Payer: Self-pay

## 2020-04-29 VITALS — BP 130/85 | HR 83 | Temp 97.3°F | Ht 68.0 in | Wt 177.6 lb

## 2020-04-29 DIAGNOSIS — R748 Abnormal levels of other serum enzymes: Secondary | ICD-10-CM

## 2020-04-29 DIAGNOSIS — E559 Vitamin D deficiency, unspecified: Secondary | ICD-10-CM

## 2020-04-29 MED ORDER — SIMVASTATIN 40 MG PO TABS: 40.0000 mg | ORAL_TABLET | Freq: Every day | ORAL | 1 refills | Status: DC

## 2020-04-29 NOTE — Progress Notes (Signed)
Metrics: Intervention Frequency ACO  Documented Smoking Status Yearly  Screened one or more times in 24 months  Cessation Counseling or  Active cessation medication Past 24 months  Past 24 months   Guideline developer: UpToDate (See UpToDate for funding source) Date Released: 2014       Wellness Office Visit  Subjective:  Patient ID: Carlos Mccann, male    DOB: [birth year]  Age: 79 y.o. MRN: [id number]  CC: This man comes in to review his blood work and further recommendations. HPI  His hyperlipidemia still shows elevation of total cholesterol and LDL cholesterol levels and he continues on simvastatin. However, I see that he has elevated liver enzymes.  He denies any symptoms of liver disease. He also has vitamin D deficiency. Past Medical History:  Diagnosis Date  . Hyperlipemia    Past Surgical History:  Procedure Laterality Date  . TRANSURETHRAL RESECTION OF PROSTATE       Family History  Problem Relation Age of Onset  . Healthy Mother   . Healthy Father     Social History   Social History Narrative   Widower since April 2021,married for 58 years.Lives alone.Retired.   Social History   Tobacco Use  . Smoking status: Former Games developer  . Smokeless tobacco: Never Used  Substance Use Topics  . Alcohol use: No    Current Meds  Medication Sig  . aspirin EC 81 MG tablet Take 81 mg by mouth daily.  . simvastatin (ZOCOR) 40 MG tablet Take 1 tablet (40 mg total) by mouth daily.  . [DISCONTINUED] simvastatin (ZOCOR) 40 MG tablet Take 1 tablet (40 mg total) by mouth daily.      No flowsheet data found.   Objective:   Today's Vitals: BP (!) 130/85 (BP Location: Left Arm, Patient Position: Sitting, Cuff Size: Normal)   Pulse 83   Temp (!) 97.3 F (36.3 C) (Temporal)   Ht 5\' 8"  (1.727 m)   Wt 177 lb 9.6 oz (80.6 kg)   SpO2 97%   BMI 27.00 kg/m  Vitals with BMI 04/29/2020 04/01/2020 02/17/2018  Height 5\' 8"  5\' 8"  -  Weight 177 lbs 10 oz 176 lbs 13 oz -  BMI  27.01 26.89 -  Systolic 130 120 02/19/2018  Diastolic 85 70 83  Pulse 83 92 54     Physical Exam  He looks systemically well.  Weight is stable.  Blood pressure is appropriate for his age.     Assessment   1. Elevated liver enzymes   2. Vitamin D deficiency disease       Tests ordered Orders Placed This Encounter  Procedures  . Hepatitis panel, acute  . COMPLETE METABOLIC PANEL WITH GFR     Plan: 1. I am going to repeat the liver enzymes and also do a hepatitis panel. 2. I recommended he start taking vitamin D3 10,000 units daily. 3. I will see him in 6 weeks time and review all his results with him and start focusing on nutrition and hopefully he can discontinue statin therapy in the future.  He does not have any history of coronary artery disease, cerebrovascular disease or diabetes.   Meds ordered this encounter  Medications  . simvastatin (ZOCOR) 40 MG tablet    Sig: Take 1 tablet (40 mg total) by mouth daily.    Dispense:  90 tablet    Refill:  1    Ashelyn Mccravy , MD

## 2020-04-29 NOTE — Patient Instructions (Signed)
TAKE VITAMIN D3 10,000 UNITS/DAY

## 2020-04-30 LAB — COMPLETE METABOLIC PANEL WITH GFR
AG Ratio: 1.3 (calc) (ref 1.0–2.5)
ALT: 38 U/L (ref 9–46)
AST: 23 U/L (ref 10–35)
Albumin: 4.2 g/dL (ref 3.6–5.1)
Alkaline phosphatase (APISO): 55 U/L (ref 35–144)
BUN/Creatinine Ratio: 9 (calc) (ref 6–22)
BUN: 11 mg/dL (ref 7–25)
CO2: 29 mmol/L (ref 20–32)
Calcium: 9.7 mg/dL (ref 8.6–10.3)
Chloride: 104 mmol/L (ref 98–110)
Creat: 1.22 mg/dL — ABNORMAL HIGH (ref 0.70–1.11)
GFR, Est African American: 64 mL/min/{1.73_m2} (ref >=60)
GFR, Est Non African American: 55 mL/min/{1.73_m2} — ABNORMAL LOW (ref >=60)
Globulin: 3.2 g/dL (calc) (ref 1.9–3.7)
Glucose, Bld: 131 mg/dL — ABNORMAL HIGH (ref 65–99)
Potassium: 4.2 mmol/L (ref 3.5–5.3)
Sodium: 140 mmol/L (ref 135–146)
Total Bilirubin: 0.4 mg/dL (ref 0.2–1.2)
Total Protein: 7.4 g/dL (ref 6.1–8.1)

## 2020-04-30 LAB — HEPATITIS PANEL, ACUTE
Hep A IgM: NONREACTIVE
Hep B C IgM: NONREACTIVE
Hepatitis B Surface Ag: NONREACTIVE
Hepatitis C Ab: NONREACTIVE
SIGNAL TO CUT-OFF: 0.03 (ref <1.00)

## 2020-06-11 ENCOUNTER — Encounter (INDEPENDENT_AMBULATORY_CARE_PROVIDER_SITE_OTHER): Payer: Self-pay | Admitting: Internal Medicine

## 2020-06-11 ENCOUNTER — Ambulatory Visit (INDEPENDENT_AMBULATORY_CARE_PROVIDER_SITE_OTHER): Payer: Medicare Other | Admitting: Internal Medicine

## 2020-06-11 ENCOUNTER — Other Ambulatory Visit: Payer: Self-pay

## 2020-06-11 VITALS — BP 140/85 | HR 85 | Temp 96.6°F | Ht 68.0 in | Wt 181.6 lb

## 2020-06-11 DIAGNOSIS — E782 Mixed hyperlipidemia: Secondary | ICD-10-CM

## 2020-06-11 DIAGNOSIS — E559 Vitamin D deficiency, unspecified: Secondary | ICD-10-CM

## 2020-06-11 DIAGNOSIS — R748 Abnormal levels of other serum enzymes: Secondary | ICD-10-CM | POA: Diagnosis not present

## 2020-06-11 NOTE — Progress Notes (Signed)
Metrics: Intervention Frequency ACO  Documented Smoking Status Yearly  Screened one or more times in 24 months  Cessation Counseling or  Active cessation medication Past 24 months  Past 24 months   Guideline developer: UpToDate (See UpToDate for funding source) Date Released: 2014       Wellness Office Visit  Subjective:  Patient ID: Carlos Mccann, male    DOB: Mar 31, 1941  Age: 79 y.o. MRN: [id number]  CC: This man comes in for follow-up of vitamin D deficiency, hyperlipidemia. HPI  Unfortunately, he says he could not find vitamin D3 and has not been taking it.  It was recommended to make sure he drinks plenty of water as his creatinine was increased the last time we check blood work.  He says he has been doing this. Thankfully, his liver enzymes are completely normal and hepatitis panel is completely negative also. Past Medical History:  Diagnosis Date  . Hyperlipemia    Past Surgical History:  Procedure Laterality Date  . TRANSURETHRAL RESECTION OF PROSTATE       Family History  Problem Relation Age of Onset  . Healthy Mother   . Healthy Father     Social History   Social History Narrative   Widower since April 2021,married for 58 years.Lives alone.Retired.   Social History   Tobacco Use  . Smoking status: Former Games developer  . Smokeless tobacco: Never Used  Substance Use Topics  . Alcohol use: No    Current Meds  Medication Sig  . aspirin EC 81 MG tablet Take 81 mg by mouth daily.  . simvastatin (ZOCOR) 40 MG tablet Take 1 tablet (40 mg total) by mouth daily.      Depression screen [facility] 2/9 06/11/2020  Decreased Interest 0  Down, Depressed, Hopeless 0  PHQ - 2 Score 0     Objective:   Today's Vitals: BP 140/85 (BP Location: Left Arm, Patient Position: Sitting, Cuff Size: Normal)   Pulse 85   Temp (!) 96.6 F (35.9 C) (Temporal)   Ht 5\' 8"  (1.727 m)   Wt 181 lb 9.6 oz (82.4 kg)   SpO2 97%   BMI 27.61 kg/m  Vitals with BMI 06/11/2020 04/29/2020  04/01/2020  Height 5\' 8"  5\' 8"  5\' 8"   Weight 181 lbs 10 oz 177 lbs 10 oz 176 lbs 13 oz  BMI 27.62 27.01 26.89  Systolic 140 130 04/03/2020  Diastolic 85 85 70  Pulse 85 83 92     Physical Exam   He looks systemically well.  He has gained about 4 pounds in weight since the last visit.  Blood pressure acceptable for his age.    Assessment   1. Vitamin D deficiency disease   2. Elevated liver enzymes   3. Mixed hyperlipidemia       Tests ordered No orders of the defined types were placed in this encounter.    Plan: 1. I have encouraged him to start taking vitamin D3 10,000 units daily. 2. I encouraged him to make sure he stays well-hydrated. 3. Follow-up in 3 months and we will do all the blood work then.   No orders of the defined types were placed in this encounter.   , MD

## 2020-07-14 ENCOUNTER — Emergency Department (HOSPITAL_COMMUNITY): Payer: Medicare Other

## 2020-07-14 ENCOUNTER — Encounter (HOSPITAL_COMMUNITY): Payer: Self-pay

## 2020-07-14 ENCOUNTER — Other Ambulatory Visit: Payer: Self-pay

## 2020-07-14 ENCOUNTER — Emergency Department (HOSPITAL_COMMUNITY)
Admission: EM | Admit: 2020-07-14 | Discharge: 2020-07-14 | Disposition: A | Discharge status: Home / Self Care | Payer: Medicare Other | Attending: Emergency Medicine | Admitting: Emergency Medicine

## 2020-07-14 DIAGNOSIS — R319 Hematuria, unspecified: Secondary | ICD-10-CM | POA: Diagnosis not present

## 2020-07-14 DIAGNOSIS — R002 Palpitations: Secondary | ICD-10-CM | POA: Insufficient documentation

## 2020-07-14 DIAGNOSIS — Z87891 Personal history of nicotine dependence: Secondary | ICD-10-CM | POA: Diagnosis not present

## 2020-07-14 DIAGNOSIS — Z7982 Long term (current) use of aspirin: Secondary | ICD-10-CM | POA: Insufficient documentation

## 2020-07-14 DIAGNOSIS — R1032 Left lower quadrant pain: Secondary | ICD-10-CM | POA: Diagnosis not present

## 2020-07-14 LAB — CBC WITH DIFFERENTIAL/PLATELET
Abs Immature Granulocytes: 0 10*3/uL (ref 0.00–0.07)
Basophils Absolute: 0.1 10*3/uL (ref 0.0–0.1)
Basophils Relative: 1 %
Eosinophils Absolute: 0.3 10*3/uL (ref 0.0–0.5)
Eosinophils Relative: 7 %
HCT: 39.8 % (ref 39.0–52.0)
Hemoglobin: 13.3 g/dL (ref 13.0–17.0)
Immature Granulocytes: 0 %
Lymphocytes Relative: 35 %
Lymphs Abs: 1.3 10*3/uL (ref 0.7–4.0)
MCH: 28.2 pg (ref 26.0–34.0)
MCHC: 33.4 g/dL (ref 30.0–36.0)
MCV: 84.5 fL (ref 80.0–100.0)
Monocytes Absolute: 0.5 10*3/uL (ref 0.1–1.0)
Monocytes Relative: 13 %
Neutro Abs: 1.6 10*3/uL — ABNORMAL LOW (ref 1.7–7.7)
Neutrophils Relative %: 44 %
Platelets: 194 10*3/uL (ref 150–400)
RBC: 4.71 MIL/uL (ref 4.22–5.81)
RDW: 13.6 % (ref 11.5–15.5)
WBC: 3.6 10*3/uL — ABNORMAL LOW (ref 4.0–10.5)
nRBC: 0 % (ref 0.0–0.2)

## 2020-07-14 LAB — URINALYSIS, ROUTINE W REFLEX MICROSCOPIC
Bacteria, UA: NONE SEEN
Bilirubin Urine: NEGATIVE
Glucose, UA: NEGATIVE mg/dL
Ketones, ur: NEGATIVE mg/dL
Leukocytes,Ua: NEGATIVE
Nitrite: NEGATIVE
Protein, ur: 30 mg/dL — AB
RBC / HPF: >50 RBC/hpf — ABNORMAL HIGH (ref 0–5)
Specific Gravity, Urine: 1.016 (ref 1.005–1.030)
pH: 5 (ref 5.0–8.0)

## 2020-07-14 LAB — BASIC METABOLIC PANEL
Anion gap: 9 (ref 5–15)
BUN: 10 mg/dL (ref 8–23)
CO2: 24 mmol/L (ref 22–32)
Calcium: 9 mg/dL (ref 8.9–10.3)
Chloride: 105 mmol/L (ref 98–111)
Creatinine, Ser: 1.05 mg/dL (ref 0.61–1.24)
GFR, Estimated: >60 mL/min (ref >60)
Glucose, Bld: 120 mg/dL — ABNORMAL HIGH (ref 70–99)
Potassium: 3.7 mmol/L (ref 3.5–5.1)
Sodium: 138 mmol/L (ref 135–145)

## 2020-07-14 MED ORDER — CEPHALEXIN 500 MG PO CAPS: 500.0000 mg | ORAL_CAPSULE | Freq: Three times a day (TID) | ORAL | 0 refills | Status: AC

## 2020-07-14 NOTE — ED Provider Notes (Signed)
[facility] EMERGENCY DEPARTMENT Provider Note  CSN: [id number] Arrival date & time: 07/14/20 3810    History Chief Complaint  Patient presents with  . Palpitations  . Flank Pain    HPI  Carlos Mccann is a 79 y.o. male with no significant PMH reports he woke up this morning and felt like his heart was racing. Did not have any chest pain or SOB, symptoms lasted about 5 minutes and resolved. He also noticed some streaks of blood when he urinated first thing this morning, associated with a mild-moderate aching pain in L flank that also resolved while driving to the ED. He denies any current symptoms. Denies any recent fever, N/V/D. He has had TURP procedure in the past.    Past Medical History:  Diagnosis Date  . Hyperlipemia     Past Surgical History:  Procedure Laterality Date  . TRANSURETHRAL RESECTION OF PROSTATE      Family History  Problem Relation Age of Onset  . Healthy Mother   . Healthy Father     Social History   Tobacco Use  . Smoking status: Former Games developer  . Smokeless tobacco: Never Used  Vaping Use  . Vaping Use: Never used  Substance Use Topics  . Alcohol use: No  . Drug use: No     Home Medications Prior to Admission medications   Medication Sig Start Date End Date Taking? Authorizing Provider  aspirin EC 81 MG tablet Take 81 mg by mouth daily.   Yes [provider]  Multiple Vitamins-Minerals (MULTIVITAMIN WITH MINERALS) tablet Take 1 tablet by mouth daily.   Yes [provider]  simvastatin (ZOCOR) 40 MG tablet Take 1 tablet (40 mg total) by mouth daily. 04/29/20  Yes Gosrani, Nimish C, MD  cephALEXin (KEFLEX) 500 MG capsule Take 1 capsule (500 mg total) by mouth 3 (three) times daily for 7 days. 07/14/20 07/21/20  Pollyann Savoy, MD     Allergies    Patient has no known allergies.   Review of Systems   Review of Systems A comprehensive review of systems was completed and negative except as noted in HPI.     Physical Exam BP 134/85   Pulse 60   Temp 98.3 F (36.8 C) (Oral)   Resp 19   Ht 5\' 8"  (1.727 m)   Wt 78.5 kg   SpO2 98%   BMI 26.30 kg/m   Physical Exam Vitals and nursing note reviewed.  Constitutional:      Appearance: Normal appearance.  HENT:     Head: Normocephalic and atraumatic.     Nose: Nose normal.     Mouth/Throat:     Mouth: Mucous membranes are moist.  Eyes:     Extraocular Movements: Extraocular movements intact.     Conjunctiva/sclera: Conjunctivae normal.  Cardiovascular:     Rate and Rhythm: Normal rate.  Pulmonary:     Effort: Pulmonary effort is normal.     Breath sounds: Normal breath sounds.  Abdominal:     General: Abdomen is flat.     Palpations: Abdomen is soft.     Tenderness: There is no abdominal tenderness.  Musculoskeletal:        General: No swelling. Normal range of motion.     Cervical back: Neck supple.  Skin:    General: Skin is warm and dry.  Neurological:     General: No focal deficit present.     Mental Status: He is alert.  Psychiatric:  Mood and Affect: Mood normal.      ED Results / Procedures / Treatments   Labs (all labs ordered are listed, but only abnormal results are displayed) Labs Reviewed  URINALYSIS, ROUTINE W REFLEX MICROSCOPIC - Abnormal; Notable for the following components:      Result Value   APPearance HAZY (*)    Hgb urine dipstick LARGE (*)    Protein, ur 30 (*)    RBC / HPF >50 (*)    All other components within normal limits  BASIC METABOLIC PANEL - Abnormal; Notable for the following components:   Glucose, Bld 120 (*)    All other components within normal limits  CBC WITH DIFFERENTIAL/PLATELET - Abnormal; Notable for the following components:   WBC 3.6 (*)    Neutro Abs 1.6 (*)    All other components within normal limits  URINE CULTURE    EKG EKG Interpretation  Date/Time:  Monday July 14 2020 07:15:59 EDT Ventricular Rate:  66 PR Interval:    QRS Duration: 61 QT  Interval:  540 QTC Calculation: 566 R Axis:   47 Text Interpretation: Sinus rhythm Borderline T abnormalities, lateral leads Prolonged QT interval No significant change since last tracing Confirmed by Susy Frizzle [phone]) on 07/14/2020 7:29:47 AM   Radiology CT Renal Stone Study  Result Date: 07/14/2020 CLINICAL DATA:  79 year old male with history of left-sided flank pain and hematuria starting this morning. EXAM: CT ABDOMEN AND PELVIS WITHOUT CONTRAST TECHNIQUE: Multidetector CT imaging of the abdomen and pelvis was performed following the standard protocol without IV contrast. COMPARISON:  CT the abdomen and pelvis 09/23/2017. FINDINGS: Lower chest: Aortic atherosclerosis. Hepatobiliary: No suspicious cystic or solid hepatic lesions are confidently identified on today's noncontrast CT examination. Unenhanced appearance of the gallbladder is normal. Pancreas: No definite pancreatic mass or peripancreatic fluid collections or inflammatory changes noted on today's noncontrast CT examination. Spleen: Unremarkable. Adrenals/Urinary Tract: There are no abnormal calcifications within the collecting system of either kidney, along the course of either ureter, or within the lumen of the urinary bladder. No hydroureteronephrosis or perinephric stranding to suggest urinary tract obstruction at this time. Multiple low-attenuation lesions in both kidneys, incompletely characterized on today's non-contrast CT examination, but statistically likely to represent cysts measuring up to 1.6 cm in diameter in the lower pole of the left kidney. Focal area of nodular mural thickening along the anterior aspect of the urinary bladder wall (axial image 59 of series 2) which appears likely to represent a decompressed bladder diverticulum based on appearance on the sagittal images (sagittal image 62 of series 5). Bilateral adrenal glands are normal in appearance. Stomach/Bowel: Unenhanced appearance of the stomach is normal. No  pathologic dilatation of small bowel or colon. The appendix is not confidently identified and may be surgically absent. Regardless, there are no inflammatory changes noted adjacent to the cecum to suggest the presence of an acute appendicitis at this time. Vascular/Lymphatic: Aortic atherosclerosis. No lymphadenopathy noted in the abdomen or pelvis. Reproductive: Prostate gland is severely enlarged measuring up to 6.1 x 6.4 x 9.2 cm. Seminal vesicles are unremarkable in appearance. Other: No significant volume of ascites.  No pneumoperitoneum. Musculoskeletal: There are no aggressive appearing lytic or blastic lesions noted in the visualized portions of the skeleton. IMPRESSION: 1. No acute findings are noted to account for the patient's history of left-sided flank pain or hematuria. 2. Severe prostatomegaly. 3. 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 Further nonemergent evaluation by  Urology should be considered. 4. Aortic atherosclerosis. Electronically Signed   By: Trudie Reed M.D.   On: 07/14/2020 08:33    Procedures Procedures  Medications Ordered in the ED Medications - No data to display   MDM Rules/Calculators/A&P MDM Patient with normal rhythm on EKG. Urine at bedside is dark amber, but no gross blood. Will check basic labs and UA.  ED Course  I have reviewed the triage vital signs and the nursing notes.  Pertinent labs & imaging results that were available during my care of the patient were reviewed by me and considered in my medical decision making (see chart for details).  Clinical Course as of Jul 15 915  [facility] Jul 14, 2020  0750 UA with blood, some WBC but no other significant signs of infection. Will send for CT stone study.    [CS]  0908 CBC, BMP normal. CT without signs of stone or other cause of hematuria. Will send UA for culture, start Keflex for possible UTI and refer to Urology for follow up.     [CS]    Clinical Course User Index [CS] Pollyann Savoy, MD    Final Clinical Impression(s) / ED Diagnoses Final diagnoses:  Palpitations  Hematuria, unspecified type    Rx / DC Orders ED Discharge Orders         Ordered    cephALEXin (KEFLEX) 500 MG capsule  3 times daily        07/14/20 0915           Pollyann Savoy, MD 07/14/20 [phone]

## 2020-07-14 NOTE — ED Triage Notes (Signed)
Pt reports that he woke up with the feeling of his heart beating fast which has subsided. Then when he urinated he noticed blood in his urine and since he has had left sided pain

## 2020-07-15 LAB — URINE CULTURE: Culture: <10000 — AB

## 2020-09-10 ENCOUNTER — Encounter (INDEPENDENT_AMBULATORY_CARE_PROVIDER_SITE_OTHER): Payer: Self-pay | Admitting: Internal Medicine

## 2020-09-10 ENCOUNTER — Ambulatory Visit (INDEPENDENT_AMBULATORY_CARE_PROVIDER_SITE_OTHER): Payer: Medicare Other | Admitting: Internal Medicine

## 2020-09-10 ENCOUNTER — Other Ambulatory Visit: Payer: Self-pay

## 2020-09-10 VITALS — BP 140/90 | HR 77 | Temp 98.4°F | Resp 18 | Ht 68.0 in | Wt 175.8 lb

## 2020-09-10 DIAGNOSIS — E559 Vitamin D deficiency, unspecified: Secondary | ICD-10-CM | POA: Diagnosis not present

## 2020-09-10 DIAGNOSIS — E782 Mixed hyperlipidemia: Secondary | ICD-10-CM | POA: Diagnosis not present

## 2020-09-10 NOTE — Progress Notes (Signed)
Metrics: Intervention Frequency ACO  Documented Smoking Status Yearly  Screened one or more times in 24 months  Cessation Counseling or  Active cessation medication Past 24 months  Past 24 months   Guideline developer: UpToDate (See UpToDate for funding source) Date Released: 2014       Wellness Office Visit  Subjective:  Patient ID: Carlos Mccann, male    DOB: 1940-11-24  Age: 79 y.o. MRN: [id number]  CC: This man comes in for follow-up of hyperlipidemia and vitamin D deficiency. HPI  He says that he has been taking vitamin D3 10,000 units daily and he feels better in terms of energy. He continues on statin therapy for hyperlipidemia.  There is no history of coronary artery disease or cerebrovascular disease. He denies any chest pain, dyspnea, palpitations or limb weakness. Past Medical History:  Diagnosis Date  . Hyperlipemia    Past Surgical History:  Procedure Laterality Date  . TRANSURETHRAL RESECTION OF PROSTATE       Family History  Problem Relation Age of Onset  . Healthy Mother   . Healthy Father     Social History   Social History Narrative   Widower since April 2021,married for 58 years.Lives alone.Retired.   Social History   Tobacco Use  . Smoking status: Former Games developer  . Smokeless tobacco: Never Used  Substance Use Topics  . Alcohol use: No    Current Meds  Medication Sig  . aspirin EC 81 MG tablet Take 81 mg by mouth daily.  . Cholecalciferol (VITAMIN D-3) 125 MCG (5000 UT) TABS Take 2 tablets by mouth daily.  . Multiple Vitamins-Minerals (MULTIVITAMIN WITH MINERALS) tablet Take 1 tablet by mouth daily.  . simvastatin (ZOCOR) 40 MG tablet Take 1 tablet (40 mg total) by mouth daily.      Depression screen [facility] 2/9 06/11/2020  Decreased Interest 0  Down, Depressed, Hopeless 0  PHQ - 2 Score 0     Objective:   Today's Vitals: BP 140/90 (BP Location: Right Arm, Patient Position: Sitting, Cuff Size: Normal)   Pulse 77   Temp 98.4 F (36.9  C) (Temporal)   Resp 18   Ht 5\' 8"  (1.727 m)   Wt 175 lb 12.8 oz (79.7 kg)   SpO2 96%   BMI 26.73 kg/m  Vitals with BMI 09/10/2020 07/14/2020 07/14/2020  Height 5\' 8"  - -  Weight 175 lbs 13 oz - -  BMI 26.74 - -  Systolic 140 152 09/13/2020  Diastolic 90 74 79  Pulse 77 71 65     Physical Exam   He looks systemically well.  Weight is stable.  Blood pressure is acceptable for his age.  He is alert and orientated without any focal neuro signs    Assessment   1. Vitamin D deficiency disease   2. Mixed hyperlipidemia       Tests ordered Orders Placed This Encounter  Procedures  . COMPLETE METABOLIC PANEL WITH GFR  . Lipid panel  . VITAMIN D 25 Hydroxy (Vit-D Deficiency, Fractures)     Plan: 1. He will continue with vitamin D3 10,000 units daily and we will check vitamin D levels now. 2. He will continue with statin therapy and we will check a lipid panel. 3. Prevnar 13 vaccination was given today. 4. Follow-up with in about 4 months.  I did recommend COVID-19 booster to him today.   No orders of the defined types were placed in this encounter.   109, MD

## 2020-12-30 ENCOUNTER — Other Ambulatory Visit (INDEPENDENT_AMBULATORY_CARE_PROVIDER_SITE_OTHER): Payer: Self-pay | Admitting: Internal Medicine

## 2021-01-07 ENCOUNTER — Encounter (INDEPENDENT_AMBULATORY_CARE_PROVIDER_SITE_OTHER): Payer: Self-pay | Admitting: Nurse Practitioner

## 2021-01-07 DIAGNOSIS — E785 Hyperlipidemia, unspecified: Secondary | ICD-10-CM | POA: Insufficient documentation

## 2021-01-15 ENCOUNTER — Ambulatory Visit (INDEPENDENT_AMBULATORY_CARE_PROVIDER_SITE_OTHER): Payer: Medicare Other | Admitting: Nurse Practitioner

## 2021-01-15 ENCOUNTER — Encounter (INDEPENDENT_AMBULATORY_CARE_PROVIDER_SITE_OTHER): Payer: Self-pay | Admitting: Nurse Practitioner

## 2021-01-15 ENCOUNTER — Other Ambulatory Visit: Payer: Self-pay

## 2021-01-15 VITALS — BP 138/80 | HR 80 | Temp 97.9°F | Ht 68.0 in | Wt 177.4 lb

## 2021-01-15 DIAGNOSIS — R202 Paresthesia of skin: Secondary | ICD-10-CM

## 2021-01-15 DIAGNOSIS — M25512 Pain in left shoulder: Secondary | ICD-10-CM

## 2021-01-15 DIAGNOSIS — E559 Vitamin D deficiency, unspecified: Secondary | ICD-10-CM

## 2021-01-15 DIAGNOSIS — E782 Mixed hyperlipidemia: Secondary | ICD-10-CM

## 2021-01-15 DIAGNOSIS — R739 Hyperglycemia, unspecified: Secondary | ICD-10-CM

## 2021-01-15 NOTE — Progress Notes (Signed)
Subjective:  Patient ID: Carlos Mccann, male    DOB: 31-Jan-1941  Age: 80 y.o. MRN: [id number]  CC:  Chief Complaint  Patient presents with  . Arm Pain    Pain in left arm at times and has numbness in all of left fingers  . Hyperlipidemia  . Other    Vitamin D deficiency, hyperglycemia      HPI  This patient arrives today for the above.  Left arm paresthesias: He notes approximately 2 weeks ago that he will experience some left arm tingling and hand numbness when he sleeps on his left side.  Otherwise he does not really experience the tingling or numbness.  He tells me that he will sometimes take a low-dose aspirin and within 30 to 60 minutes the numbness and tingling resolves.  He denies any chest pain, palpitations, significant shortness of breath, change in his energy levels.  He denies any history of neck injury or back injury.  Vitamin D deficiency: He continues on 5000 IUs of vitamin D3 daily.  Last serum check was collected in June and was 27.  Hyperglycemia: Per chart review I have noted he has had some elevated glucose on metabolic panels in the past.  I do not see where A1c has been checked and he will be due for this today.  Past Medical History:  Diagnosis Date  . Hyperlipemia       Family History  Problem Relation Age of Onset  . Healthy Mother   . Healthy Father     Social History   Social History Narrative   Widower since April 2021,married for 82 years.Lives alone.Retired.   Social History   Tobacco Use  . Smoking status: Former Research scientist (life sciences)  . Smokeless tobacco: Never Used  Substance Use Topics  . Alcohol use: No     Current Meds  Medication Sig  . aspirin EC 81 MG tablet Take 81 mg by mouth daily.  . Cholecalciferol (VITAMIN D-3) 125 MCG (5000 UT) TABS Take 1 tablet by mouth daily.  . Multiple Vitamins-Minerals (MULTIVITAMIN WITH MINERALS) tablet Take 1 tablet by mouth daily.  . simvastatin (ZOCOR) 40 MG tablet TAKE 1 TABLET BY MOUTH EVERY  DAY    ROS:  Review of Systems  Constitutional: Negative for diaphoresis and malaise/fatigue.  Respiratory: Negative for shortness of breath.   Cardiovascular: Negative for chest pain and palpitations.  Neurological: Positive for sensory change. Negative for dizziness, weakness and headaches.     Objective:   Today's Vitals: BP 138/80   Pulse 80   Temp 97.9 F (36.6 C) (Temporal)   Ht 5' 8"  (1.727 m)   Wt 177 lb 6.4 oz (80.5 kg)   SpO2 98%   BMI 26.97 kg/m  Vitals with BMI 01/15/2021 09/10/2020 07/14/2020  Height 5' 8"  5' 8"  -  Weight 177 lbs 6 oz 175 lbs 13 oz -  BMI 74.25 95.63 -  Systolic 875 643 329  Diastolic 80 90 74  Pulse 80 77 71     Physical Exam Vitals reviewed.  Constitutional:      Appearance: Normal appearance.  HENT:     Head: Normocephalic and atraumatic.  Cardiovascular:     Rate and Rhythm: Normal rate and regular rhythm.  Pulmonary:     Effort: Pulmonary effort is normal.     Breath sounds: Normal breath sounds.  Musculoskeletal:     Right shoulder: Normal.     Left shoulder: No swelling, deformity, bony tenderness or crepitus.  Decreased range of motion (some pain with internal rotation). Normal strength. Normal pulse.     Cervical back: Neck supple.  Skin:    General: Skin is warm and dry.  Neurological:     Mental Status: He is alert and oriented to person, place, and time.  Psychiatric:        Mood and Affect: Mood normal.        Behavior: Behavior normal.        Thought Content: Thought content normal.        Judgment: Judgment normal.          Assessment and Plan   1. Paresthesia   2. Acute pain of left shoulder   3. Vitamin D deficiency disease   4. Mixed hyperlipidemia   5. Hyperglycemia      Plan: 1.,  2.  I think he most likely has a pinched nerve in his shoulder which is causing the sensory changes down his left arm and into his left hand.  He does not seem to have any symptoms of acute coronary syndrome at this  time.  We will start by getting an x-ray of the left shoulder, and further recommendations may be made based upon his results.  I have encouraged him to try to avoid sleeping on the left side for now to see if this helps was these episodes. 3.  We will check vitamin D level and metabolic panel today. 4.  We will check lipid panel.  He will continue on his current medications as prescribed in the meantime. 5.  We will check A1c for evaluation of possible prediabetes or diabetes.   Tests ordered Orders Placed This Encounter  Procedures  . DG Shoulder Left  . Vitamin D, 25-hydroxy  . CMP with eGFR([facility])  . Lipid Panel  . Hemoglobin A1c      No orders of the defined types were placed in this encounter.   Patient to follow-up in 6 weeks or sooner as needed.  Ailene Ards, NP

## 2021-01-16 LAB — COMPLETE METABOLIC PANEL WITH GFR
AG Ratio: 1.3 (calc) (ref 1.0–2.5)
ALT: 17 U/L (ref 9–46)
AST: 20 U/L (ref 10–35)
Albumin: 4.3 g/dL (ref 3.6–5.1)
Alkaline phosphatase (APISO): 50 U/L (ref 35–144)
BUN: 14 mg/dL (ref 7–25)
CO2: 25 mmol/L (ref 20–32)
Calcium: 9.5 mg/dL (ref 8.6–10.3)
Chloride: 104 mmol/L (ref 98–110)
Creat: 1.07 mg/dL (ref 0.70–1.11)
GFR, Est African American: 75 mL/min/{1.73_m2} (ref >=60)
GFR, Est Non African American: 64 mL/min/{1.73_m2} (ref >=60)
Globulin: 3.2 g/dL (calc) (ref 1.9–3.7)
Glucose, Bld: 92 mg/dL (ref 65–139)
Potassium: 3.8 mmol/L (ref 3.5–5.3)
Sodium: 138 mmol/L (ref 135–146)
Total Bilirubin: 0.4 mg/dL (ref 0.2–1.2)
Total Protein: 7.5 g/dL (ref 6.1–8.1)

## 2021-01-16 LAB — HEMOGLOBIN A1C
Hgb A1c MFr Bld: 6.4 % of total Hgb — ABNORMAL HIGH (ref <5.7)
Mean Plasma Glucose: 137 mg/dL
eAG (mmol/L): 7.6 mmol/L

## 2021-01-16 LAB — LIPID PANEL
Cholesterol: 178 mg/dL (ref <200)
HDL: 49 mg/dL (ref >=40)
LDL Cholesterol (Calc): 112 mg/dL (calc) — ABNORMAL HIGH
Non-HDL Cholesterol (Calc): 129 mg/dL (calc) (ref <130)
Total CHOL/HDL Ratio: 3.6 (calc) (ref <5.0)
Triglycerides: 82 mg/dL (ref <150)

## 2021-01-16 LAB — VITAMIN D 25 HYDROXY (VIT D DEFICIENCY, FRACTURES): Vit D, 25-Hydroxy: 27 ng/mL — ABNORMAL LOW (ref 30–100)

## 2021-01-21 ENCOUNTER — Emergency Department (HOSPITAL_COMMUNITY)
Admission: EM | Admit: 2021-01-21 | Discharge: 2021-01-21 | Disposition: A | Discharge status: Home / Self Care | Payer: Medicare Other | Attending: Emergency Medicine | Admitting: Emergency Medicine

## 2021-01-21 ENCOUNTER — Emergency Department (HOSPITAL_COMMUNITY): Payer: Medicare Other

## 2021-01-21 ENCOUNTER — Other Ambulatory Visit: Payer: Self-pay

## 2021-01-21 ENCOUNTER — Encounter (HOSPITAL_COMMUNITY): Payer: Self-pay

## 2021-01-21 ENCOUNTER — Telehealth (INDEPENDENT_AMBULATORY_CARE_PROVIDER_SITE_OTHER): Payer: Self-pay

## 2021-01-21 DIAGNOSIS — R202 Paresthesia of skin: Secondary | ICD-10-CM | POA: Diagnosis present

## 2021-01-21 DIAGNOSIS — Z7982 Long term (current) use of aspirin: Secondary | ICD-10-CM | POA: Diagnosis not present

## 2021-01-21 DIAGNOSIS — Z87891 Personal history of nicotine dependence: Secondary | ICD-10-CM | POA: Diagnosis not present

## 2021-01-21 DIAGNOSIS — M5412 Radiculopathy, cervical region: Secondary | ICD-10-CM | POA: Diagnosis not present

## 2021-01-21 LAB — TROPONIN I (HIGH SENSITIVITY): Troponin I (High Sensitivity): 2 ng/L (ref <18)

## 2021-01-21 LAB — CBC WITH DIFFERENTIAL/PLATELET
Abs Immature Granulocytes: 0.01 10*3/uL (ref 0.00–0.07)
Basophils Absolute: 0 10*3/uL (ref 0.0–0.1)
Basophils Relative: 1 %
Eosinophils Absolute: 0.2 10*3/uL (ref 0.0–0.5)
Eosinophils Relative: 6 %
HCT: 42.1 % (ref 39.0–52.0)
Hemoglobin: 13.9 g/dL (ref 13.0–17.0)
Immature Granulocytes: 0 %
Lymphocytes Relative: 43 %
Lymphs Abs: 1.6 10*3/uL (ref 0.7–4.0)
MCH: 28.4 pg (ref 26.0–34.0)
MCHC: 33 g/dL (ref 30.0–36.0)
MCV: 85.9 fL (ref 80.0–100.0)
Monocytes Absolute: 0.5 10*3/uL (ref 0.1–1.0)
Monocytes Relative: 12 %
Neutro Abs: 1.4 10*3/uL — ABNORMAL LOW (ref 1.7–7.7)
Neutrophils Relative %: 38 %
Platelets: 191 10*3/uL (ref 150–400)
RBC: 4.9 MIL/uL (ref 4.22–5.81)
RDW: 13.7 % (ref 11.5–15.5)
WBC: 3.8 10*3/uL — ABNORMAL LOW (ref 4.0–10.5)
nRBC: 0 % (ref 0.0–0.2)

## 2021-01-21 LAB — BASIC METABOLIC PANEL
Anion gap: 9 (ref 5–15)
BUN: 14 mg/dL (ref 8–23)
CO2: 25 mmol/L (ref 22–32)
Calcium: 9.3 mg/dL (ref 8.9–10.3)
Chloride: 102 mmol/L (ref 98–111)
Creatinine, Ser: 1.08 mg/dL (ref 0.61–1.24)
GFR, Estimated: >60 mL/min (ref >60)
Glucose, Bld: 115 mg/dL — ABNORMAL HIGH (ref 70–99)
Potassium: 4.3 mmol/L (ref 3.5–5.1)
Sodium: 136 mmol/L (ref 135–145)

## 2021-01-21 MED ORDER — PREDNISONE 10 MG PO TABS: ORAL_TABLET | ORAL | 0 refills | Status: DC

## 2021-01-21 NOTE — Discharge Instructions (Addendum)
Take the medication as directed.  Please follow-up with your primary care provider for recheck.  Also, you may contact the orthopedic provider listed, Dr. Romeo Apple to arrange a follow-up appointment if your symptoms are not improving.  Return to the emergency department for any new or worsening symptoms.

## 2021-01-21 NOTE — ED Notes (Signed)
EKG delayed due to monitor malfunction

## 2021-01-21 NOTE — ED Notes (Signed)
Pauline Aus PA at bedside for MSE prior to triage complete.

## 2021-01-21 NOTE — Telephone Encounter (Signed)
Pt was called; no way to leave a voicemail. Pt is now in [facility] radiology getting imagines completed.   Transition Care Management Follow-up Telephone Call Date of discharge and from where: Home How have you been since you were released from the hospital? Been at my x-ray appt. I called and told your office. Pt is upset today.  Any questions or concerns? No, I had to go do some other visits and I was not able to answer phone from your calls today. Items Reviewed: Did the pt receive and understand the discharge instructions provided? Yes, Megan given lab results. Medications obtained and verified? yes Other? none Any new allergies since your discharge? no Dietary orders reviewed? Do you have support at home? Home Care and Equipment/Supplies: Were home health services ordered?  If so, what is the name of the agency? none Has the agency set up a time to come to the patient's home?  Were any new equipment or medical supplies ordered?   What is the name of the medical supply agency? n/a Were you able to get the supplies/equipment?  Do you have any questions related to the use of the equipment or supplies?   Functional Questionnaire: (I = Independent and D = Dependent) ADLs:   Bathing/Dressing-  Meal Prep-   Eating-   Maintaining continence-    Transferring/Ambulation-  Managing Meds-  Follow up appointments reviewed:  PCP Hospital f/u appt confirmed? No  Scheduled to see on @   Specialist Hospital f/u appt confirmed?N/A No  Scheduled to see on  @ Are transportation arrangements needed? No  N/A    If their condition worsens, is the pt aware to call PCP or go to the Emergency Dept.? Was the patient provided with contact information for the PCP's office or ED? Was to pt encouraged to call back with questions or concerns?

## 2021-01-21 NOTE — ED Triage Notes (Signed)
Pt presents to ED with complaints of left arm pain x 1 week. Pt states feels like a sting.

## 2021-01-21 NOTE — ED Provider Notes (Signed)
[facility] EMERGENCY DEPARTMENT Provider Note   CSN: [id number] Arrival date & time: 01/21/21  6811     History Chief Complaint  Patient presents with  . Arm Pain    Carlos Mccann is a 80 y.o. male.  HPI     Carlos Mccann is a 80 y.o. male with past medical history of hyperlipidemia who presents to the Emergency Department complaining of burning stinging sensation of the left arm.  Symptoms have been present for 1 week.  He describes the sensation as a stinging and burning from his left shoulder down to his forearm.  Symptoms are intermittent lasting anywhere from 1 minute to 20 minutes.  Symptoms have been reproduced with certain movements of his left arm.  He denies any chest pain, shortness of breath, diaphoresis, nausea, headache, neck pain, visual changes or weakness of his extremities.  No known injury.    Past Medical History:  Diagnosis Date  . Hyperlipemia     Patient Active Problem List   Diagnosis Date Noted  . HLD (hyperlipidemia) 01/07/2021    Past Surgical History:  Procedure Laterality Date  . TRANSURETHRAL RESECTION OF PROSTATE         Family History  Problem Relation Age of Onset  . Healthy Mother   . Healthy Father     Social History   Tobacco Use  . Smoking status: Former Games developer  . Smokeless tobacco: Never Used  Vaping Use  . Vaping Use: Never used  Substance Use Topics  . Alcohol use: No  . Drug use: No    Home Medications Prior to Admission medications   Medication Sig Start Date End Date Taking? Authorizing Provider  aspirin EC 81 MG tablet Take 81 mg by mouth daily.    [provider]  Cholecalciferol (VITAMIN D-3) 125 MCG (5000 UT) TABS Take 1 tablet by mouth daily.    [provider]  Multiple Vitamins-Minerals (MULTIVITAMIN WITH MINERALS) tablet Take 1 tablet by mouth daily.    [provider]  simvastatin (ZOCOR) 40 MG tablet TAKE 1 TABLET BY MOUTH EVERY DAY 12/30/20   Lucy Singer, MD     Allergies    Patient has no known allergies.  Review of Systems   Review of Systems  Constitutional: Negative for chills and fever.  Eyes: Negative for visual disturbance.  Respiratory: Negative for chest tightness and shortness of breath.   Cardiovascular: Negative for chest pain and palpitations.  Gastrointestinal: Negative for abdominal pain, nausea and vomiting.  Musculoskeletal: Positive for arthralgias, joint swelling and myalgias (burning, stinging sensation of left arm). Negative for neck pain and neck stiffness.  Skin: Negative for color change, rash and wound.  Neurological: Negative for dizziness, facial asymmetry, speech difficulty, weakness, numbness and headaches.    Physical Exam Updated Vital Signs BP (!) 153/94   Pulse 69   Temp 98 F (36.7 C) (Oral)   Resp 16   Ht 5\' 8"  (1.727 m)   Wt 79.8 kg   SpO2 99%   BMI 26.76 kg/m   Physical Exam Vitals and nursing note reviewed.  Constitutional:      General: He is not in acute distress.    Appearance: Normal appearance. He is not ill-appearing or diaphoretic.  HENT:     Head: Atraumatic.     Mouth/Throat:     Mouth: Mucous membranes are moist.  Eyes:     Extraocular Movements: Extraocular movements intact.     Conjunctiva/sclera: Conjunctivae normal.  Pupils: Pupils are equal, round, and reactive to light.  Cardiovascular:     Rate and Rhythm: Normal rate and regular rhythm.     Pulses: Normal pulses.  Pulmonary:     Effort: Pulmonary effort is normal.     Breath sounds: Normal breath sounds.  Chest:     Chest wall: No tenderness.  Abdominal:     Palpations: Abdomen is soft. There is no mass.     Tenderness: There is no abdominal tenderness.  Musculoskeletal:        General: No swelling, tenderness or signs of injury. Normal range of motion.     Cervical back: Normal range of motion and neck supple. No tenderness.  Lymphadenopathy:     Cervical: No cervical adenopathy.  Skin:    General:  Skin is warm.     Capillary Refill: Capillary refill takes less than 2 seconds.     Findings: No erythema or rash.  Neurological:     General: No focal deficit present.     Mental Status: He is alert.     Sensory: No sensory deficit.     Motor: No weakness.     ED Results / Procedures / Treatments   Labs (all labs ordered are listed, but only abnormal results are displayed) Labs Reviewed  BASIC METABOLIC PANEL - Abnormal; Notable for the following components:      Result Value   Glucose, Bld 115 (*)    All other components within normal limits  CBC WITH DIFFERENTIAL/PLATELET - Abnormal; Notable for the following components:   WBC 3.8 (*)    Neutro Abs 1.4 (*)    All other components within normal limits  TROPONIN I (HIGH SENSITIVITY)    EKG EKG Interpretation  Date/Time:  Wednesday January 21 2021 10:17:39 EDT Ventricular Rate:  63 PR Interval:  186 QRS Duration: 70 QT Interval:  394 QTC Calculation: 403 R Axis:   63 Text Interpretation: Normal sinus rhythm Nonspecific T wave abnormality Abnormal ECG No significant change since last tracing Confirmed by Vanetta Mulders [phone]) on 01/21/2021 10:22:15 AM   Radiology DG Chest 2 View  Result Date: 01/21/2021 CLINICAL DATA:  Left arm pain. EXAM: CHEST - 2 VIEW COMPARISON:  Chest x-ray 02/17/2018. FINDINGS: Mediastinum and hilar structures normal. Heart size normal. Mild right upper lung subsegmental atelectasis. No pleural effusion or pneumothorax. No acute bony abnormality. Degenerative change thoracic spine. IMPRESSION: Mild subsegmental atelectasis right upper lung. Electronically Signed   By: Maisie Fus  Register   On: 01/21/2021 11:36   CT Cervical Spine Wo Contrast  Result Date: 01/21/2021 CLINICAL DATA:  Left arm pain for the past week.  No injury. EXAM: CT CERVICAL SPINE WITHOUT CONTRAST TECHNIQUE: Multidetector CT imaging of the cervical spine was performed without intravenous contrast. Multiplanar CT image reconstructions  were also generated. COMPARISON:  None. FINDINGS: Alignment: Reversal of the normal cervical lordosis.  No listhesis. Skull base and vertebrae: No acute fracture. No primary bone lesion or focal pathologic process. Soft tissues and spinal canal: No prevertebral fluid or swelling. No visible canal hematoma. Disc levels: C2-C3: Small central disc protrusion. Severe right and mild left facet arthropathy. No stenosis. C3-C4: Small posterior disc osteophyte complex. Moderate bilateral uncovertebral hypertrophy. Moderate right and mild left facet arthropathy. Severe bilateral neuroforaminal stenosis. No spinal canal stenosis. C4-C5: Small posterior disc osteophyte complex. Moderate bilateral uncovertebral hypertrophy. Moderate left and mild right facet arthropathy. Moderate right and severe left neuroforaminal stenosis. C5-C6: Small posterior disc osteophyte complex. Severe bilateral uncovertebral  hypertrophy. At most mild bilateral neuroforaminal stenosis. No spinal canal stenosis. C6-C7: Small posterior disc osteophyte complex and mild bilateral uncovertebral hypertrophy. No stenosis. C7-T1: Minimal disc bulging and mild circumferential endplate spurring. No stenosis. Upper chest: Negative. Other: None. IMPRESSION: 1. 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 Severe neuroforaminal stenosis bilaterally at C3-C4 and on the left at C4-C5. Electronically Signed   By: Obie Dredge M.D.   On: 01/21/2021 11:30   DG Shoulder Left  Result Date: 01/21/2021 CLINICAL DATA:  Left arm pain. EXAM: LEFT SHOULDER - 2+ VIEW COMPARISON:  None. FINDINGS: There is no evidence of fracture or dislocation. Mild degenerative changes seen involving the left acromioclavicular joint. Soft tissues are unremarkable. 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 No acute abnormality is noted. Electronically Signed   By: Lupita Raider M.D.   On: 01/21/2021  11:36    Procedures Procedures   Medications Ordered in ED Medications - No data to display  ED Course  I have reviewed the triage vital signs and the nursing notes.  Pertinent labs & imaging results that were available during my care of the patient were reviewed by me and considered in my medical decision making (see chart for details).    MDM Rules/Calculators/A&P                          Patient is 80 year old male with intermittent paresthesias of the left arm.  No known injury.  Symptoms reproducible with movement.  Likely this is musculoskeletal, doubtful of acute ACS.  although given patient's age and he does have some risk factors will further evaluate for cardiac process.  On recheck, patient resting comfortably.  Denies any symptoms at present.  EKG without acute ischemic changes.  Chest x-ray without acute finding.  Troponin reassuring.  Labs show leukopenia which is baseline, no significant electrolyte abnormality.  X-ray of the left shoulder shows some degenerative changes of the [facility] joint and CT of the C-spine shows some neuroforaminal stenosis bilaterally.  Patient also seen by Dr. Deretha Emory and care plan discussed.  Patient appears appropriate for discharge home.  He does have most PCP follow-up and I will provide outpatient information for local orthopedics.  He is agreeable to plan.  Strict return precautions were discussed. The patient appears reasonably screened and/or stabilized for discharge and I doubt any other medical condition or other [facility] requiring further screening, evaluation, or treatment in the ED at this time prior to discharge.  Final Clinical Impression(s) / ED Diagnoses Final diagnoses:  Cervical radiculopathy    Rx / DC Orders ED Discharge Orders    None       Pauline Aus, PA-C 01/21/21 1221    Vanetta Mulders, MD 01/22/21 [phone]

## 2021-01-21 NOTE — ED Provider Notes (Signed)
I provided a substantive portion of the care of this patient.  I personally performed the entirety of the history, exam and medical decision making for this encounter.    Patient seen by me along with physician assistant.  Patient with a complaint of left arm pain.  No neck pain no chest pain.  Does have some tingling in the fingers.  It is intermittent.  Is been going on for a few days.  Exam without any significant neurological findings of the left upper extremity.  We will approach this as possible atypical chest pain complaint.  But clinically really seems to be more consistent with a radicular type pain from the left side of the neck although no pain to the left side of the neck.       Vanetta Mulders, MD 01/22/21 0800

## 2021-01-21 NOTE — ED Notes (Signed)
Patient transported to X-ray 

## 2021-01-26 ENCOUNTER — Inpatient Hospital Stay (INDEPENDENT_AMBULATORY_CARE_PROVIDER_SITE_OTHER): Payer: Medicare Other | Admitting: Internal Medicine

## 2021-02-03 ENCOUNTER — Ambulatory Visit (INDEPENDENT_AMBULATORY_CARE_PROVIDER_SITE_OTHER): Payer: Medicare Other | Admitting: Internal Medicine

## 2021-02-03 ENCOUNTER — Other Ambulatory Visit: Payer: Self-pay

## 2021-02-03 ENCOUNTER — Encounter (INDEPENDENT_AMBULATORY_CARE_PROVIDER_SITE_OTHER): Payer: Self-pay | Admitting: Internal Medicine

## 2021-02-03 VITALS — BP 124/82 | HR 80 | Temp 97.7°F | Ht 68.0 in | Wt 174.6 lb

## 2021-02-03 DIAGNOSIS — M4802 Spinal stenosis, cervical region: Secondary | ICD-10-CM

## 2021-02-03 DIAGNOSIS — M25512 Pain in left shoulder: Secondary | ICD-10-CM

## 2021-02-03 NOTE — Progress Notes (Signed)
Metrics: Intervention Frequency ACO  Documented Smoking Status Yearly  Screened one or more times in 24 months  Cessation Counseling or  Active cessation medication Past 24 months  Past 24 months   Guideline developer: UpToDate (See UpToDate for funding source) Date Released: 2014       Wellness Office Visit  Subjective:  Patient ID: Carlos Mccann, male    DOB: 1940-11-12  Age: 80 y.o. MRN: [id number]  CC: Left shoulder pain HPI  This patient went to the emergency room with left arm and shoulder pain.  He had further evaluation with a left shoulder x-ray which showed some arthritis but also a cervical spine x-ray which did show cervical spine stenosis. He was treated empirically with steroids and he seems to have improved significantly. Past Medical History:  Diagnosis Date  . Hyperlipemia    Past Surgical History:  Procedure Laterality Date  . TRANSURETHRAL RESECTION OF PROSTATE       Family History  Problem Relation Age of Onset  . Healthy Mother   . Healthy Father     Social History   Social History Narrative   Widower since April 2021,married for 58 years.Lives alone.Retired.   Social History   Tobacco Use  . Smoking status: Former Games developer  . Smokeless tobacco: Never Used  Substance Use Topics  . Alcohol use: No    Current Meds  Medication Sig  . aspirin EC 81 MG tablet Take 81 mg by mouth daily.  . Cholecalciferol (VITAMIN D-3) 125 MCG (5000 UT) TABS Take 2 tablets by mouth daily.  . Multiple Vitamins-Minerals (MULTIVITAMIN WITH MINERALS) tablet Take 1 tablet by mouth daily.  . predniSONE (DELTASONE) 10 MG tablet Take 6 tablets day one, 5 tablets day two, 4 tablets day three, 3 tablets day four, 2 tablets day five, then 1 tablet day six  . simvastatin (ZOCOR) 40 MG tablet TAKE 1 TABLET BY MOUTH EVERY DAY     Flowsheet Row Office Visit from 01/15/2021 in [facility]  PHQ-9 Total Score 0      Objective:   Today's Vitals: BP 124/82    Pulse 80   Temp 97.7 F (36.5 C) (Temporal)   Ht 5\' 8"  (1.727 m)   Wt 174 lb 9.6 oz (79.2 kg)   SpO2 97%   BMI 26.55 kg/m  Vitals with BMI 02/03/2021 01/21/2021 01/21/2021  Height 5\' 8"  - -  Weight 174 lbs 10 oz - -  BMI 26.55 - -  Systolic 124 133 01/23/2021  Diastolic 82 86 99  Pulse 80 61 69     Physical Exam  I examined the strength in both his arms and he has normal strength in both his arms without any neurological deficits.  There is also no evidence of abnormal sensation.     Assessment   1. Acute pain of left shoulder   2. Cervical stenosis of spine       Tests ordered No orders of the defined types were placed in this encounter.    Plan: 1. He will continue and finish the prednisone course. 2. I have told him that he does have significant cervical stenosis of the spine at certain levels and if he gets neurological symptoms, he will need referral to a neurosurgeon.  I think for the time being we can hold off on this referral. 3. Follow-up with Sarah as previously scheduled at the end of the month   No orders of the defined types were placed in this encounter.  Doree Albee, MD

## 2021-02-23 ENCOUNTER — Encounter (INDEPENDENT_AMBULATORY_CARE_PROVIDER_SITE_OTHER): Payer: Self-pay | Admitting: Nurse Practitioner

## 2021-02-23 ENCOUNTER — Ambulatory Visit (INDEPENDENT_AMBULATORY_CARE_PROVIDER_SITE_OTHER): Payer: Medicare Other | Admitting: Nurse Practitioner

## 2021-02-23 ENCOUNTER — Other Ambulatory Visit: Payer: Self-pay

## 2021-02-23 VITALS — BP 130/80 | HR 89 | Temp 97.3°F | Resp 18 | Ht 68.0 in | Wt 172.0 lb

## 2021-02-23 DIAGNOSIS — R7303 Prediabetes: Secondary | ICD-10-CM | POA: Insufficient documentation

## 2021-02-23 DIAGNOSIS — E559 Vitamin D deficiency, unspecified: Secondary | ICD-10-CM | POA: Diagnosis not present

## 2021-02-23 DIAGNOSIS — E785 Hyperlipidemia, unspecified: Secondary | ICD-10-CM

## 2021-02-23 DIAGNOSIS — M19012 Primary osteoarthritis, left shoulder: Secondary | ICD-10-CM | POA: Diagnosis not present

## 2021-02-23 NOTE — Progress Notes (Signed)
Subjective:  Patient ID: Carlos Mccann, male    DOB: [birth year]  Age: 80 y.o. MRN: [id number]  CC:  Chief Complaint  Patient presents with  . Follow-up  . Other    Vitamin D deficiency, left shoulder pain  . Hyperlipidemia  . Prediabetes      HPI  This patient arrives today for the above.  Vitamin D deficiency: Last serum check was collected about 1 month ago and it was 6.  He continues on 10,000 IUs of vitamin D3 daily reports taking this supplement on a daily basis.  Left shoulder pain: He also continues to have left shoulder pain that is much improved with prednisone course.  He did have x-rays done in ER for evaluation of this last month.  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 Karilyn Cota has noted and has told the patient if he starts to have neurologic symptoms he will need to be referred to neurosurgeon.  Patient continues to deny neurologic symptoms.  He tells me the pain is now usually well controlled with use of Tylenol and/or topical ointment that he puts on it.  Hyperlipidemia: Last lipid panel was collected about 1 month ago and compared to lipid panel before that is much improved.  LDL dropped from 1 49-1 12.  Total cholesterol dropped from 2 34-1 78.  He continues on simvastatin and aspirin.  Prediabetes: Last A1c was collected about 1 month ago and it was 6.4.  Prediabetes:  Past Medical History:  Diagnosis Date  . Hyperlipemia       Family History  Problem Relation Age of Onset  . Healthy Mother   . Healthy Father     Social History   Social History Narrative   Widower since April 2021,married for 58 years.Lives alone.Retired.   Social History   Tobacco Use  . Smoking status: Former Games developer  . Smokeless tobacco: Never Used  Substance Use Topics  . Alcohol use: No     Current Meds  Medication Sig  . aspirin EC 81 MG tablet Take 81 mg by mouth daily.  . Cholecalciferol (VITAMIN D-3) 125 MCG  (5000 UT) TABS Take 3 tablets by mouth daily.  . Multiple Vitamins-Minerals (MULTIVITAMIN WITH MINERALS) tablet Take 1 tablet by mouth daily.  . simvastatin (ZOCOR) 40 MG tablet TAKE 1 TABLET BY MOUTH EVERY DAY  . [DISCONTINUED] predniSONE (DELTASONE) 10 MG tablet Take 6 tablets day one, 5 tablets day two, 4 tablets day three, 3 tablets day four, 2 tablets day five, then 1 tablet day six    ROS:  Review of Systems  Respiratory: Negative for shortness of breath.   Cardiovascular: Negative for chest pain.  Musculoskeletal: Positive for joint pain (left shoulder pain).  Neurological: Negative for dizziness and headaches.     Objective:   Today's Vitals: BP 130/80 (BP Location: Left Arm, Patient Position: Sitting, Cuff Size: Normal)   Pulse 89   Temp (!) 97.3 F (36.3 C) (Temporal)   Resp 18   Ht 5\' 8"  (1.727 m)   Wt 172 lb (78 kg)   SpO2 96%   BMI 26.15 kg/m  Vitals with BMI 02/23/2021 02/03/2021 01/21/2021  Height 5\' 8"  5\' 8"  -  Weight 172 lbs 174 lbs 10 oz -  BMI 26.16 26.55 -  Systolic 130 124 01/23/2021  Diastolic 80 82 86  Pulse 89 80 61     Physical Exam Vitals reviewed.  Constitutional:  Appearance: Normal appearance.  HENT:     Head: Normocephalic and atraumatic.  Cardiovascular:     Rate and Rhythm: Normal rate and regular rhythm.  Pulmonary:     Effort: Pulmonary effort is normal.     Breath sounds: Normal breath sounds.  Musculoskeletal:     Cervical back: Neck supple.  Skin:    General: Skin is warm and dry.  Neurological:     Mental Status: He is alert and oriented to person, place, and time.  Psychiatric:        Mood and Affect: Mood normal.        Behavior: Behavior normal.        Thought Content: Thought content normal.        Judgment: Judgment normal.          Assessment and Plan   1. Hyperlipidemia, unspecified hyperlipidemia type   2. Acute pain of left shoulder   3. Prediabetes   4. Vitamin D deficiency      Plan: 1.  LDL still  slightly above goal but much improved compared to last lipid panel.  He will continue on current medications as prescribed. 2.  Seems to be well controlled with use of Tylenol and as needed topical ointment.  He will continue taking these as needed. 3.  We did discuss lifestyle changes especially diet and I have given him a handout regarding recommendations aimed at helping him control prediabetes and preventing conversion to type 2 diabetes. 4.  Recommend he increase his vitamin D3 supplement to 15,000 IUs by mouth daily, and he will follow-up in about 2 months so we can recheck serum levels for close monitoring.   Tests ordered No orders of the defined types were placed in this encounter.     No orders of the defined types were placed in this encounter.   Patient to follow-up in 2 months or sooner as needed.  Elenore Paddy, NP

## 2021-02-23 NOTE — Patient Instructions (Signed)
[facility] Dietary Recommendations for Weight Loss What to Avoid . Avoid added sugars o Often added sugar can be found in processed foods such as many condiments, dry cereals, cakes, cookies, chips, crisps, crackers, candies, sweetened drinks, etc.  o Read labels and AVOID/DECREASE use of foods with the following in their ingredient list: Sugar, fructose, high fructose corn syrup, sucrose, glucose, maltose, dextrose, molasses, cane sugar, brown sugar, any type of syrup, agave nectar, etc.   . Avoid snacking in between meals . Avoid foods made with flour o If you are going to eat food made with flour, choose those made with whole-grains; and, minimize your consumption as much as is tolerable . Avoid processed foods o These foods are generally stocked in the middle of the grocery store. Focus on shopping on the perimeter of the grocery.  . Avoid Meat  o We recommend following a plant-based diet at [facility]. Thus, we recommend avoiding meat as a general rule. Consider eating beans, legumes, eggs, and/or dairy products for regular protein sources o If you plan on eating meat limit to 4 ounces of meat at a time and choose lean options such as Fish, chicken, turkey. Avoid red meat intake such as pork and/or steak What to Include . Vegetables o GREEN LEAFY VEGETABLES: Kale, spinach, mustard greens, collard greens, cabbage, broccoli, etc. o OTHER: Asparagus, cauliflower, eggplant, carrots, peas, Brussel sprouts, tomatoes, bell peppers, zucchini, beets, cucumbers, etc. . Grains, seeds, and legumes o Beans: kidney beans, black eyed peas, garbanzo beans, black beans, pinto beans, etc. o Whole, unrefined grains: brown rice, barley, bulgur, oatmeal, etc. . Healthy fats  o Avoid highly processed fats such as vegetable oil o Examples of healthy fats: avocado, olives, virgin olive oil, dark chocolate (?72% Cocoa), nuts (peanuts, almonds, walnuts, cashews, pecans, etc.) . None to Low  Intake of Animal Sources of Protein o Meat sources: chicken, turkey, salmon, tuna. Limit to 4 ounces of meat at one time. o Consider limiting dairy sources, but when choosing dairy focus on: PLAIN Greek yogurt, cottage cheese, high-protein milk . Fruit o Choose berries  When to Eat . Intermittent Fasting: o Choosing not to eat for a specific time period, but DO FOCUS ON HYDRATION when fasting o Multiple Techniques: - Time Restricted Eating: eat 3 meals in a day, each meal lasting no more than 60 minutes, no snacks between meals - 16-18 hour fast: fast for 16 to 18 hours up to 7 days a week. Often suggested to start with 2-3 nonconsecutive days per week.  . Remember the time you sleep is counted as fasting.  . Examples of eating schedule: Fast from 7:00pm-11:00am. Eat between 11:00am-7:00pm.  - 24-hour fast: fast for 24 hours up to every other day. Often suggested to start with 1 day per week . Remember the time you sleep is counted as fasting . Examples of eating schedule:  o Eating day: eat 2-3 meals on your eating day. If doing 2 meals, each meal should last no more than 90 minutes. If doing 3 meals, each meal should last no more than 60 minutes. Finish last meal by 7:00pm. o Fasting day: Fast until 7:00pm.  o IF YOU FEEL UNWELL FOR ANY REASON/IN ANY WAY WHEN FASTING, STOP FASTING BY EATING A NUTRITIOUS SNACK OR LIGHT MEAL o ALWAYS FOCUS ON HYDRATION DURING FASTS - Acceptable Hydration sources: water, broths, tea/coffee (black tea/coffee is best but using a small amount of whole-fat dairy products in coffee/tea is acceptable).  -   Poor Hydration Sources: anything with sugar or artificial sweeteners added to it  These recommendations have been developed for patients that are actively receiving medical care from either Dr. [facility] or Aerionna Moravek, DNP, NP-C at [facility]. These recommendations are developed for patients with specific medical conditions and are not meant to be  distributed or used by others that are not actively receiving care from either provider listed above at [facility]. It is not appropriate to participate in the above eating plans without proper medical supervision.   Reference: Fung, J. The obesity code. [place]: [place]; 2016.   

## 2021-02-25 ENCOUNTER — Ambulatory Visit (INDEPENDENT_AMBULATORY_CARE_PROVIDER_SITE_OTHER): Payer: Medicare Other | Admitting: Nurse Practitioner

## 2021-02-26 ENCOUNTER — Ambulatory Visit (INDEPENDENT_AMBULATORY_CARE_PROVIDER_SITE_OTHER): Payer: Medicare Other | Admitting: Nurse Practitioner

## 2021-03-30 ENCOUNTER — Ambulatory Visit (INDEPENDENT_AMBULATORY_CARE_PROVIDER_SITE_OTHER): Payer: Medicare Other

## 2021-03-30 NOTE — Progress Notes (Deleted)
Subjective:   Carlos Mccann is a 80 y.o. male who presents for an Initial Medicare Annual Wellness Visit.  Review of Systems    N/A        Objective:    There were no vitals filed for this visit. There is no height or weight on file to calculate BMI.  Advanced Directives 01/21/2021 02/17/2018 09/23/2017 09/23/2017 01/01/2017 06/06/2016 05/22/2016  Does Patient Have a Medical Advance Directive? No No - No No No No  Would patient like information on creating a medical advance directive? - - Yes (ED - Information included in AVS) No - Patient declined No - Patient declined - No - patient declined information    Current Medications (verified) Outpatient Encounter Medications as of 03/30/2021  Medication Sig   aspirin EC 81 MG tablet Take 81 mg by mouth daily.   Cholecalciferol (VITAMIN D-3) 125 MCG (5000 UT) TABS Take 3 tablets by mouth daily.   Multiple Vitamins-Minerals (MULTIVITAMIN WITH MINERALS) tablet Take 1 tablet by mouth daily.   simvastatin (ZOCOR) 40 MG tablet TAKE 1 TABLET BY MOUTH EVERY DAY   No facility-administered encounter medications on file as of 03/30/2021.    Allergies (verified) Patient has no known allergies.   History: Past Medical History:  Diagnosis Date   Hyperlipemia    Past Surgical History:  Procedure Laterality Date   TRANSURETHRAL RESECTION OF PROSTATE     Family History  Problem Relation Age of Onset   Healthy Mother    Healthy Father    Social History   Socioeconomic History   Marital status: Widowed    Spouse name: Not on file   Number of children: Not on file   Years of education: Not on file   Highest education level: Not on file  Occupational History   Occupation: retired  Tobacco Use   Smoking status: Former    Pack years: 0.00   Smokeless tobacco: Never  Vaping Use   Vaping Use: Never used  Substance and Sexual Activity   Alcohol use: No   Drug use: No   Sexual activity: Not on file  Other Topics Concern   Not on file   Social History Narrative   Widower since April 2021,married for 58 years.Lives alone.Retired.   Social Determinants of Health   Financial Resource Strain: Not on file  Food Insecurity: Not on file  Transportation Needs: Not on file  Physical Activity: Not on file  Stress: Not on file  Social Connections: Not on file    Tobacco Counseling Counseling given: Not Answered   Clinical Intake:                 Diabetic?No         Activities of Daily Living No flowsheet data found.  Patient Care Team: Loughridge Singer, MD as PCP - General (Internal Medicine)  Indicate any recent Medical Services you may have received from other than [facility] providers in the past year (date may be approximate).     Assessment:   This is a routine wellness examination for Carlos Mccann.  Hearing/Vision screen No results found.  Dietary issues and exercise activities discussed:     Goals Addressed   None    Depression Screen PHQ 2/9 Scores 01/15/2021 06/11/2020  PHQ - 2 Score 0 0  PHQ- 9 Score 0 -  Exception Documentation - Medical reason    Fall Risk Fall Risk  01/15/2021 06/11/2020  Falls in the past year? 0 0  Number falls  in past yr: - 0  Injury with Fall? - 0  Risk for fall due to : - No Fall Risks  Follow up - Falls evaluation completed    FALL RISK PREVENTION PERTAINING TO THE HOME:  Any stairs in or around the home? {YES/NO:21197} If so, are there any without handrails? No  Home free of loose throw rugs in walkways, pet beds, electrical cords, etc? Yes  Adequate lighting in your home to reduce risk of falls? Yes   ASSISTIVE DEVICES UTILIZED TO PREVENT FALLS:  Life alert? {YES/NO:21197} Use of a cane, walker or w/c? {YES/NO:21197} Grab bars in the bathroom? {YES/NO:21197} Shower chair or bench in shower? {YES/NO:21197} Elevated toilet seat or a handicapped toilet? {YES/NO:21197}  TIMED UP AND GO:  Was the test performed? Yes .  Length of time to ambulate 10  feet: *** sec.   {Appearance of ZOXW:9604540}  Cognitive Function:        Immunizations Immunization History  Administered Date(s) Administered   Influenza-Unspecified 07/04/2020   Moderna SARS-COV2 Booster Vaccination 01/22/2021   Moderna Sars-Covid-2 Vaccination 12/05/2019, 01/02/2020   Pneumococcal Conjugate-13 09/10/2020    TDAP status: Due, Education has been provided regarding the importance of this vaccine. Advised may receive this vaccine at local pharmacy or Health Dept. Aware to provide a copy of the vaccination record if obtained from local pharmacy or Health Dept. Verbalized acceptance and understanding.  Flu Vaccine status: Up to date  Pneumococcal vaccine status: Up to date  Covid-19 vaccine status: Completed vaccines  Qualifies for Shingles Vaccine? Yes   Zostavax completed No   Shingrix Completed?: No.    Education has been provided regarding the importance of this vaccine. Patient has been advised to call insurance company to determine out of pocket expense if they have not yet received this vaccine. Advised may also receive vaccine at local pharmacy or Health Dept. Verbalized acceptance and understanding.  Screening Tests Health Maintenance  Topic Date Due   TETANUS/TDAP  Never done   Zoster Vaccines- Shingrix (1 of 2) Never done   INFLUENZA VACCINE  05/04/2021   COVID-19 Vaccine (4 - Booster for Moderna series) 05/24/2021   PNA vac Low Risk Adult (2 of 2 - PPSV23) 09/10/2021   HPV VACCINES  Aged Out    Health Maintenance  Health Maintenance Due  Topic Date Due   TETANUS/TDAP  Never done   Zoster Vaccines- Shingrix (1 of 2) Never done    Colorectal cancer screening: No longer required.   Lung Cancer Screening: (Low Dose CT Chest recommended if Age 62-80 years, 30 pack-year currently smoking OR have quit w/in 15years.) does not qualify.   Lung Cancer Screening Referral: N/a   Additional Screening:  Hepatitis C Screening: does not qualify;    Vision Screening: Recommended annual ophthalmology exams for early detection of glaucoma and other disorders of the eye. Is the patient up to date with their annual eye exam?  {YES/NO:21197} Who is the provider or what is the name of the office in which the patient attends annual eye exams? *** If pt is not established with a provider, would they like to be referred to a provider to establish care? {YES/NO:21197}.   Dental Screening: Recommended annual dental exams for proper oral hygiene  Community Resource Referral / Chronic Care Management: CRR required this visit?  No   CCM required this visit?  No      Plan:     I have personally reviewed and noted the following in the patient's  chart:   Medical and social history Use of alcohol, tobacco or illicit drugs  Current medications and supplements including opioid prescriptions. {Opioid Prescriptions:9807675243} Functional ability and status Nutritional status Physical activity Advanced directives List of other physicians Hospitalizations, surgeries, and ER visits in previous 12 months Vitals Screenings to include cognitive, depression, and falls Referrals and appointments  In addition, I have reviewed and discussed with patient certain preventive protocols, quality metrics, and best practice recommendations. A written personalized care plan for preventive services as well as general preventive health recommendations were provided to patient.     Theodora Blow, LPN   2/35/5732   Nurse Notes: ***

## 2021-04-15 ENCOUNTER — Ambulatory Visit (INDEPENDENT_AMBULATORY_CARE_PROVIDER_SITE_OTHER): Payer: Medicare Other | Admitting: Nurse Practitioner

## 2021-04-21 ENCOUNTER — Ambulatory Visit (INDEPENDENT_AMBULATORY_CARE_PROVIDER_SITE_OTHER): Payer: Medicare Other | Admitting: Nurse Practitioner

## 2021-05-07 ENCOUNTER — Encounter (INDEPENDENT_AMBULATORY_CARE_PROVIDER_SITE_OTHER): Payer: Self-pay | Admitting: Nurse Practitioner

## 2021-05-07 ENCOUNTER — Ambulatory Visit (INDEPENDENT_AMBULATORY_CARE_PROVIDER_SITE_OTHER): Payer: Medicare Other | Admitting: Nurse Practitioner

## 2021-05-07 ENCOUNTER — Encounter (INDEPENDENT_AMBULATORY_CARE_PROVIDER_SITE_OTHER): Payer: Self-pay

## 2021-05-07 ENCOUNTER — Other Ambulatory Visit: Payer: Self-pay

## 2021-05-07 VITALS — BP 128/80 | HR 75 | Temp 97.2°F | Ht 68.0 in | Wt 175.8 lb

## 2021-05-07 DIAGNOSIS — E559 Vitamin D deficiency, unspecified: Secondary | ICD-10-CM

## 2021-05-07 DIAGNOSIS — E785 Hyperlipidemia, unspecified: Secondary | ICD-10-CM

## 2021-05-07 DIAGNOSIS — H9202 Otalgia, left ear: Secondary | ICD-10-CM | POA: Diagnosis not present

## 2021-05-07 MED ORDER — SIMVASTATIN 40 MG PO TABS: 40.0000 mg | ORAL_TABLET | Freq: Every day | ORAL | 0 refills | Status: AC

## 2021-05-07 NOTE — Progress Notes (Signed)
   Subjective:  Patient ID: Carlos Mccann, male    DOB: 11/25/1940  Age: 80 y.o. MRN: [id number]  CC:  Chief Complaint  Patient presents with   Follow-up    Left ear is hurting and feels like something is in ear   Other    Vitamin D deficiency   Hyperlipidemia      HPI  This patient arrives today for the above.  Left ear pain: He reports that yesterday his left ear was hurting him.  He tells me he took some over-the-counter medication and the pain went away.  He also felt that something was moving in his ear.  Today, he tells me the pain is no longer there.  He also denies any hearing loss or discharge from his ear  He is no longer feeling something moving in his ear.  Vitamin D deficiency: He has a history of vitamin D3 deficiency.  He tells me he has been taking 10,000 IUs of vitamin D3 daily.  He is due to have serum checked today.  Hyperlipidemia: He continues on simvastatin and is due for refill.  Past Medical History:  Diagnosis Date   Hyperlipemia       Family History  Problem Relation Age of Onset   Healthy Mother    Healthy Father     Social History   Social History Narrative   Widower since April 2021,married for 58 years.Lives alone.Retired.   Social History   Tobacco Use   Smoking status: Former   Smokeless tobacco: Never  Substance Use Topics   Alcohol use: No     Current Meds  Medication Sig   aspirin EC 81 MG tablet Take 81 mg by mouth daily.   Cholecalciferol (VITAMIN D-3) 125 MCG (5000 UT) TABS Take 2 tablets by mouth daily.   Multiple Vitamins-Minerals (MULTIVITAMIN WITH MINERALS) tablet Take 1 tablet by mouth daily.   [DISCONTINUED] simvastatin (ZOCOR) 40 MG tablet TAKE 1 TABLET BY MOUTH EVERY DAY    ROS:  Review of Systems  HENT:  Negative for ear discharge, ear pain, hearing loss and tinnitus.   Eyes:  Negative for blurred vision.  Respiratory:  Negative for shortness of breath.   Cardiovascular:  Negative for chest pain and  palpitations.  Neurological:  Negative for headaches.    Objective:   Today's Vitals: BP 128/80   Pulse 75   Temp (!) 97.2 F (36.2 C) (Temporal)   Ht 5' 8" (1.727 m)   Wt 175 lb 12.8 oz (79.7 kg)   SpO2 97%   BMI 26.73 kg/m  Vitals with BMI 05/07/2021 02/23/2021 02/03/2021  Height 5' 8" 5' 8" 5' 8"  Weight 175 lbs 13 oz 172 lbs 174 lbs 10 oz  BMI 26.74 26.16 26.55  Systolic 128 130 124  Diastolic 80 80 82  Pulse 75 89 80     Physical Exam Vitals reviewed.  Constitutional:      Appearance: Normal appearance.  HENT:     Head: Normocephalic and atraumatic.     Right Ear: Hearing, tympanic membrane, ear canal and external ear normal. No decreased hearing noted. No drainage or tenderness. No middle ear effusion. There is no impacted cerumen. No foreign body.     Left Ear: Hearing, tympanic membrane, ear canal and external ear normal. No decreased hearing noted. No drainage or tenderness.  No middle ear effusion. There is no impacted cerumen. No foreign body.  Cardiovascular:     Rate and Rhythm: Normal rate   and regular rhythm.  Pulmonary:     Effort: Pulmonary effort is normal.     Breath sounds: Normal breath sounds.  Musculoskeletal:     Cervical back: Neck supple.  Skin:    General: Skin is warm and dry.  Neurological:     Mental Status: He is alert and oriented to person, place, and time.  Psychiatric:        Mood and Affect: Mood normal.        Behavior: Behavior normal.        Thought Content: Thought content normal.        Judgment: Judgment normal.         Assessment and Plan   1. Vitamin D deficiency   2. Hyperlipidemia, unspecified hyperlipidemia type   3. Left ear pain      Plan: 1.  We will check vitamin D level. 2.  We will refill his simvastatin. 3.  Seems to have resolved, no abnormalities noted.  He was encouraged let me know if pain or sensation of something moving in there returns.   Tests ordered Orders Placed This Encounter  Procedures    Vitamin D, 25-hydroxy   CMP with eGFR([facility])      Meds ordered this encounter  Medications   simvastatin (ZOCOR) 40 MG tablet    Sig: Take 1 tablet (40 mg total) by mouth daily.    Dispense:  90 tablet    Refill:  0    Order Specific Question:   Supervising Provider    Answer:   PATEL, RUTWIK K [[id number]]    Patient scheduled for follow-up as this office is closing permanently as of 06/03/21 due to the passing of Dr. Gosrani.  The patient was notified of this and that they will need to find a new primary care provider.  They express understanding.  SARAH E GRAY, NP  

## 2021-05-07 NOTE — Patient Instructions (Signed)
Patient Engagement Center Phone Number: [phone]

## 2022-05-04 ENCOUNTER — Other Ambulatory Visit: Payer: Self-pay

## 2022-05-04 ENCOUNTER — Encounter (HOSPITAL_COMMUNITY): Payer: Self-pay | Admitting: Emergency Medicine

## 2022-05-04 ENCOUNTER — Emergency Department (HOSPITAL_COMMUNITY)
Admission: EM | Admit: 2022-05-04 | Discharge: 2022-05-04 | Disposition: A | Discharge status: Home / Self Care | Payer: Medicare Other | Attending: Emergency Medicine | Admitting: Emergency Medicine

## 2022-05-04 ENCOUNTER — Emergency Department (HOSPITAL_COMMUNITY): Payer: Medicare Other

## 2022-05-04 DIAGNOSIS — R319 Hematuria, unspecified: Secondary | ICD-10-CM | POA: Diagnosis present

## 2022-05-04 DIAGNOSIS — Z7982 Long term (current) use of aspirin: Secondary | ICD-10-CM | POA: Diagnosis not present

## 2022-05-04 DIAGNOSIS — N3001 Acute cystitis with hematuria: Secondary | ICD-10-CM

## 2022-05-04 LAB — URINALYSIS, ROUTINE W REFLEX MICROSCOPIC
Bilirubin Urine: NEGATIVE
Glucose, UA: NEGATIVE mg/dL
Ketones, ur: NEGATIVE mg/dL
Nitrite: POSITIVE — AB
Protein, ur: 100 mg/dL — AB
RBC / HPF: >50 RBC/hpf — ABNORMAL HIGH (ref 0–5)
Specific Gravity, Urine: 1.016 (ref 1.005–1.030)
WBC, UA: >50 WBC/hpf — ABNORMAL HIGH (ref 0–5)
pH: 5 (ref 5.0–8.0)

## 2022-05-04 MED ORDER — LIDOCAINE HCL (PF) 1 % IJ SOLN
INTRAMUSCULAR | Status: AC
  Administered 2022-05-04: 2.1 mL
  Filled 2022-05-04: qty 5

## 2022-05-04 MED ORDER — CEFTRIAXONE SODIUM 1 G IJ SOLR
1.0000 g | Freq: Once | INTRAMUSCULAR | Status: AC
  Administered 2022-05-04: 1 g via INTRAMUSCULAR
  Filled 2022-05-04: qty 10

## 2022-05-04 MED ORDER — CEPHALEXIN 500 MG PO CAPS: 500.0000 mg | ORAL_CAPSULE | Freq: Two times a day (BID) | ORAL | 0 refills | Status: AC

## 2022-05-04 NOTE — ED Provider Notes (Signed)
[facility] EMERGENCY DEPARTMENT Provider Note   CSN: [id number] Arrival date & time: 05/04/22  0216     History  Chief Complaint  Patient presents with   Hematuria    Carlos Mccann is a 81 y.o. male.  Presents to the emergency department for evaluation of burning with urination and blood in his urine that has been present through the day.  Patient does report that the last time he urinated there was no pain and the blood seems to be clearing.  No associated fever, nausea, vomiting, flank pain.       Home Medications Prior to Admission medications   Medication Sig Start Date End Date Taking? Authorizing Provider  cephALEXin (KEFLEX) 500 MG capsule Take 1 capsule (500 mg total) by mouth 2 (two) times daily. 05/04/22  Yes Ahmari Garton, Canary Brim, MD  aspirin EC 81 MG tablet Take 81 mg by mouth daily.    [provider]  Cholecalciferol (VITAMIN D-3) 125 MCG (5000 UT) TABS Take 2 tablets by mouth daily.    [provider]  Multiple Vitamins-Minerals (MULTIVITAMIN WITH MINERALS) tablet Take 1 tablet by mouth daily.    [provider]  simvastatin (ZOCOR) 40 MG tablet Take 1 tablet (40 mg total) by mouth daily. 05/07/21   Elenore Paddy, NP      Allergies    Patient has no known allergies.    Review of Systems   Review of Systems  Physical Exam Updated Vital Signs BP (!) 163/81   Pulse 63   Temp 97.8 F (36.6 C)   Resp 17   Ht 5\' 8"  (1.727 m)   Wt 80 kg   SpO2 99%   BMI 26.82 kg/m  Physical Exam Vitals and nursing note reviewed.  Constitutional:      General: He is not in acute distress.    Appearance: He is well-developed.  HENT:     Head: Normocephalic and atraumatic.     Mouth/Throat:     Mouth: Mucous membranes are moist.  Eyes:     General: Vision grossly intact. Gaze aligned appropriately.     Extraocular Movements: Extraocular movements intact.     Conjunctiva/sclera: Conjunctivae normal.  Cardiovascular:     Rate and Rhythm:  Normal rate and regular rhythm.     Pulses: Normal pulses.     Heart sounds: Normal heart sounds, S1 normal and S2 normal. No murmur heard.    No friction rub. No gallop.  Pulmonary:     Effort: Pulmonary effort is normal. No respiratory distress.     Breath sounds: Normal breath sounds.  Abdominal:     Palpations: Abdomen is soft.     Tenderness: There is no abdominal tenderness. There is no guarding or rebound.     Hernia: No hernia is present.  Musculoskeletal:        General: No swelling.     Cervical back: Full passive range of motion without pain, normal range of motion and neck supple. No pain with movement, spinous process tenderness or muscular tenderness. Normal range of motion.     Right lower leg: No edema.     Left lower leg: No edema.  Skin:    General: Skin is warm and dry.     Capillary Refill: Capillary refill takes less than 2 seconds.     Findings: No ecchymosis, erythema, lesion or wound.  Neurological:     Mental Status: He is alert and oriented to person, place, and time.  GCS: GCS eye subscore is 4. GCS verbal subscore is 5. GCS motor subscore is 6.     Cranial Nerves: Cranial nerves 2-12 are intact.     Sensory: Sensation is intact.     Motor: Motor function is intact. No weakness or abnormal muscle tone.     Coordination: Coordination is intact.  Psychiatric:        Mood and Affect: Mood normal.        Speech: Speech normal.        Behavior: Behavior normal.     ED Results / Procedures / Treatments   Labs (all labs ordered are listed, but only abnormal results are displayed) Labs Reviewed  URINALYSIS, ROUTINE W REFLEX MICROSCOPIC - Abnormal; Notable for the following components:      Result Value   APPearance HAZY (*)    Hgb urine dipstick LARGE (*)    Protein, ur 100 (*)    Nitrite POSITIVE (*)    Leukocytes,Ua MODERATE (*)    RBC / HPF >50 (*)    WBC, UA >50 (*)    Bacteria, UA RARE (*)    All other components within normal limits  URINE  CULTURE    EKG None  Radiology CT RENAL STONE STUDY  Result Date: 05/04/2022 CLINICAL DATA:  Hematuria and left-sided flank pain. EXAM: CT ABDOMEN AND PELVIS WITHOUT CONTRAST TECHNIQUE: Multidetector CT imaging of the abdomen and pelvis was performed following the standard protocol without IV contrast. RADIATION DOSE REDUCTION: This exam was performed according to the departmental dose-optimization program which includes automated exposure control, adjustment of the mA and/or kV according to patient size and/or use of iterative reconstruction technique. COMPARISON:  07/14/2020. FINDINGS: Lower chest: Mild atelectasis is noted at the lung bases. Hepatobiliary: No focal liver abnormality is seen. No gallstones, gallbladder wall thickening, or biliary dilatation. Pancreas: Unremarkable. No pancreatic ductal dilatation or surrounding inflammatory changes. Spleen: Normal in size without focal abnormality. Adrenals/Urinary Tract: No adrenal nodule or mass. Renal cysts are noted bilaterally. No renal calculus or obstructive uropathy bilaterally. Mild diffuse bladder wall thickening is noted. There is a possible diverticulum along the anterior superior aspect of the urinary bladder. Stomach/Bowel: Stomach is within normal limits. Appendix appears normal. No evidence of bowel wall thickening, distention, or inflammatory changes. No free air or pneumatosis. Scattered diverticula are present along the colon without evidence of diverticulitis. Vascular/Lymphatic: Aortic atherosclerosis. No enlarged abdominal or pelvic lymph nodes. Reproductive: The prostate gland is markedly enlarged. Other: No abdominopelvic ascites. Small fat containing umbilical hernia. Musculoskeletal: Fixation hardware is present in the proximal right femur. Degenerative changes are noted in the lower lumbar spine. IMPRESSION: 1. No renal calculus or obstructive uropathy bilaterally. Bilateral renal cysts. 2. Mild bladder wall thickening, possible  infectious or inflammatory cystitis. Possible diverticulum in the anterior superior aspect of the urinary bladder. Cystoscopy is suggested for further evaluation on follow-up. 3. Markedly enlarged prostate gland. 4. Colonic diverticulosis without diverticulitis. 5. Aortic atherosclerosis. Electronically Signed   By: Thornell Sartorius M.D.   On: 05/04/2022 04:18    Procedures Procedures    Medications Ordered in ED Medications  cefTRIAXone (ROCEPHIN) injection 1 g (has no administration in time range)    ED Course/ Medical Decision Making/ A&P                           Medical Decision Making Amount and/or Complexity of Data Reviewed Labs: ordered. Radiology: ordered.  Risk Prescription drug management.  Patient presents to the emergency department for evaluation of hematuria and dysuria.  Patient reports that symptoms are now improving.  He appears well.  No signs of pyelonephritis.  No concern for sepsis.  Urinalysis does suggest infection.  CT scan does not show any signs of ureterolithiasis.  Discharged with antibiotics, follow-up with PCP in 1 week for recheck of urine.  Given return precautions.        Final Clinical Impression(s) / ED Diagnoses Final diagnoses:  Acute cystitis with hematuria    Rx / DC Orders ED Discharge Orders          Ordered    cephALEXin (KEFLEX) 500 MG capsule  2 times daily        05/04/22 0423              Gilda Crease, MD 05/04/22 [id number]

## 2022-05-04 NOTE — ED Triage Notes (Signed)
Pt c/o hematuria and dysuria that started Monday.

## 2022-05-06 LAB — URINE CULTURE: Culture: >=100000 — AB

## 2022-05-07 ENCOUNTER — Telehealth: Payer: Self-pay | Admitting: *Deleted

## 2022-05-07 NOTE — Telephone Encounter (Signed)
Post ED Visit - Positive Culture Follow-up: Unsuccessful Patient Follow-up  Culture assessed and recommendations reviewed by:  []  , Pharm.D. []  Enzo Bi, Pharm.D., BCPS AQ-ID []  , Pharm.D., BCPS []  Celedonio Miyamoto, .D., BCPS []  [place]gil[place], .D., BCPS, AAHIVP []  Georgina Pillion, Pharm.D., BCPS, AAHIVP []  [address], PharmD []  , PharmD, BCPS  Positive urine culture  []  Patient discharged without antimicrobial prescription and treatment is now indicated [x]  Organism is resistant to prescribed ED discharge antimicrobial []  Patient with positive blood cultures  Plan:  Stop Cephalexin, if sx are still present start Cipro 500mg  PO BID x 7 days, [place], [facility]  Unable to contact patient after 3 attempts, letter will be sent to address on file  05/07/2022, 12:59 PM

## 2022-05-07 NOTE — Progress Notes (Addendum)
ED Antimicrobial Stewardship Positive Culture Follow Up   Carlos Mccann is an 81 y.o. male who presented to [facility] on 05/04/2022 with a chief complaint of hematuria and dysuria. UA remarkable--nitrite (+), RBC>50, WBC>50, no squamous cells reported. Urine cx growing pseudomonas.  Chief Complaint  Patient presents with   Hematuria   Recent Results (from the past 720 hour(s))  Urine Culture     Status: Abnormal   Collection Time: 05/04/22  3:01 AM   Specimen: Urine, Clean Catch  Result Value Ref Range Status   Specimen Description   Final    URINE, CLEAN CATCH Performed at [facility], [address] [place], [place] [address]    Special Requests   Final    NONE Performed at [facility], [address] [place], [place] [address]    Culture >=100,000 COLONIES/mL PSEUDOMONAS AERUGINOSA (A)  Final   Report Status 05/06/2022 FINAL  Final   Organism ID, Bacteria PSEUDOMONAS AERUGINOSA (A)  Final      Susceptibility   Pseudomonas aeruginosa - MIC*    CEFTAZIDIME 4 SENSITIVE Sensitive     CIPROFLOXACIN <=0.25 SENSITIVE Sensitive     GENTAMICIN <=1 SENSITIVE Sensitive     IMIPENEM <=0.25 SENSITIVE Sensitive     PIP/TAZO 8 SENSITIVE Sensitive     CEFEPIME 1 SENSITIVE Sensitive     * >=100,000 COLONIES/mL PSEUDOMONAS AERUGINOSA    [x]  Treated with cephalexin, provides no pseudomonal coverage. STOP cephalexin. Call pt to f/u if urinary sx remain. If sx present, initiate Cipro 500mg  BID PO x 7 days. If no sx, no further antibiotics necessary.   ED Provider: , MD    , PharmD PGY1 Pharmacy Resident 8/4/20239:28 AM  Clinical Pharmacist Monday - Friday phone -  [phone] Saturday - Sunday phone - [phone]

## 2023-02-27 IMAGING — CT CT CERVICAL SPINE W/O CM
3 of 4 series · 11 of 33 positions shown, 13 images · non-contrast
Comparison: None.

CLINICAL DATA: Left arm pain for the past week.  No injury.

EXAM:
CT CERVICAL SPINE WITHOUT CONTRAST
TECHNIQUE: Multidetector CT imaging of the cervical spine was performed without
intravenous contrast. Multiplanar CT image reconstructions were also
generated.

[Series 5: sag bone · sagittal · 0.26mm/px · 5 of 61 slices shown, 6 images]
[im 21/61  bone]
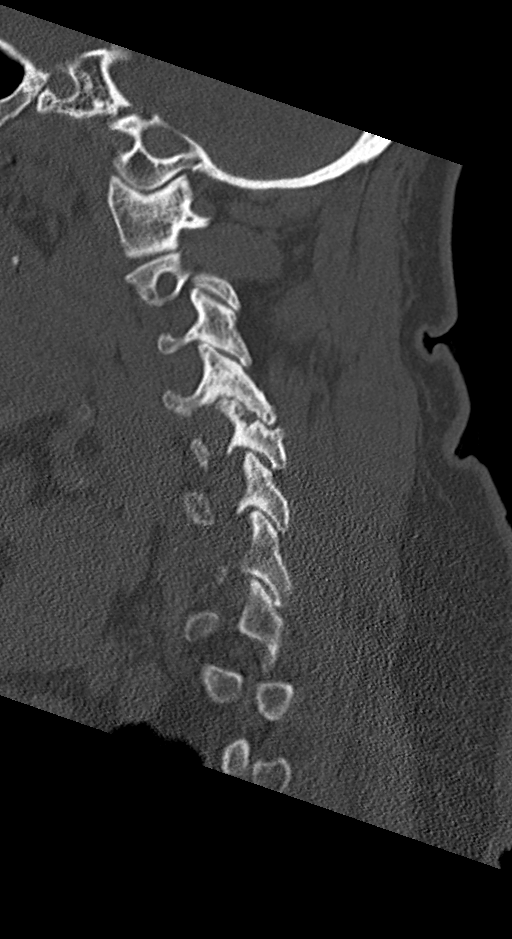
[im 26/61  bone]
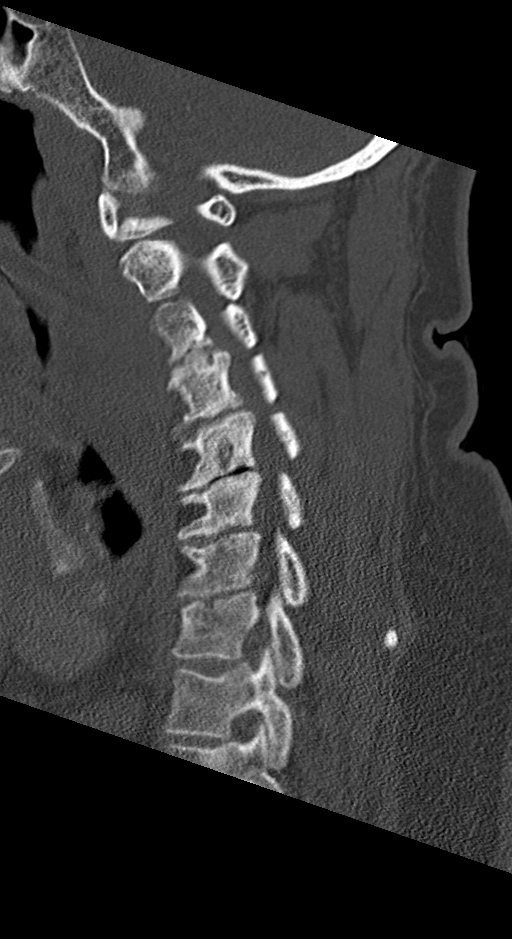
[im 31/61  soft-tissue]
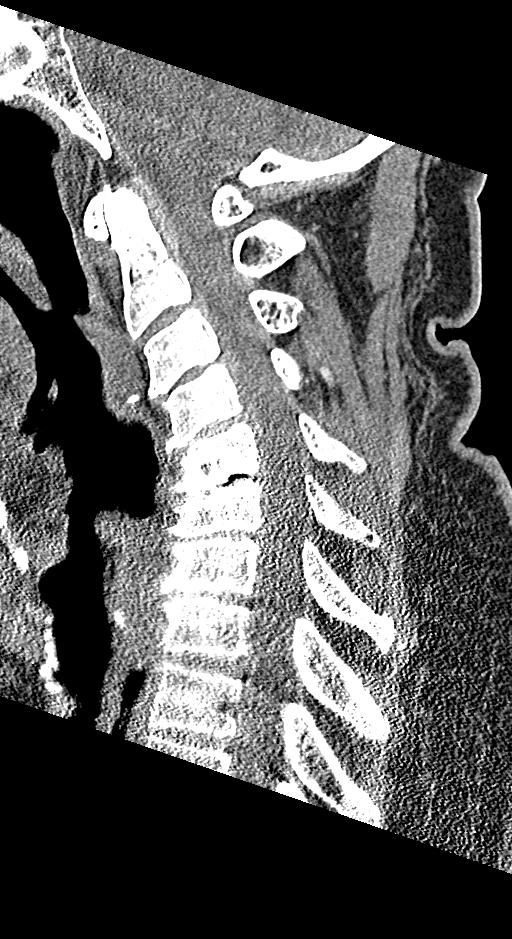
[im 31/61  bone]
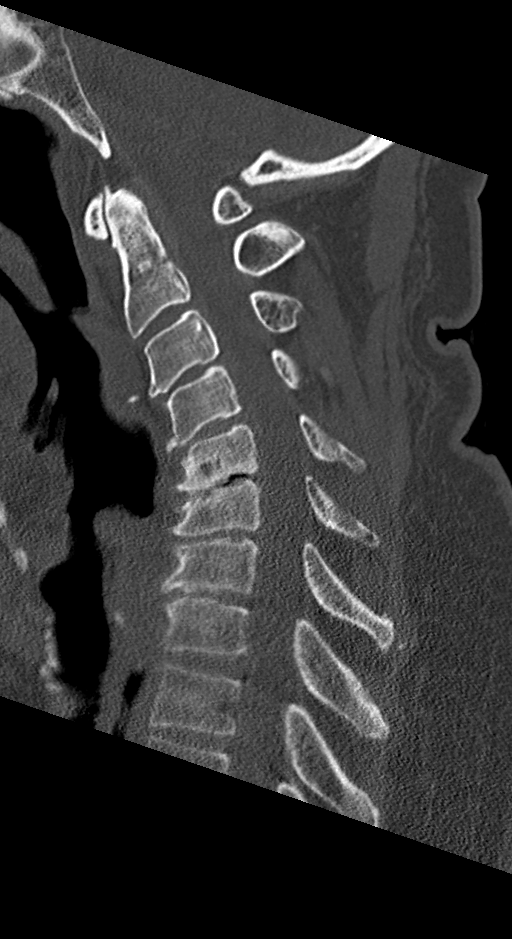
[im 36/61  bone]
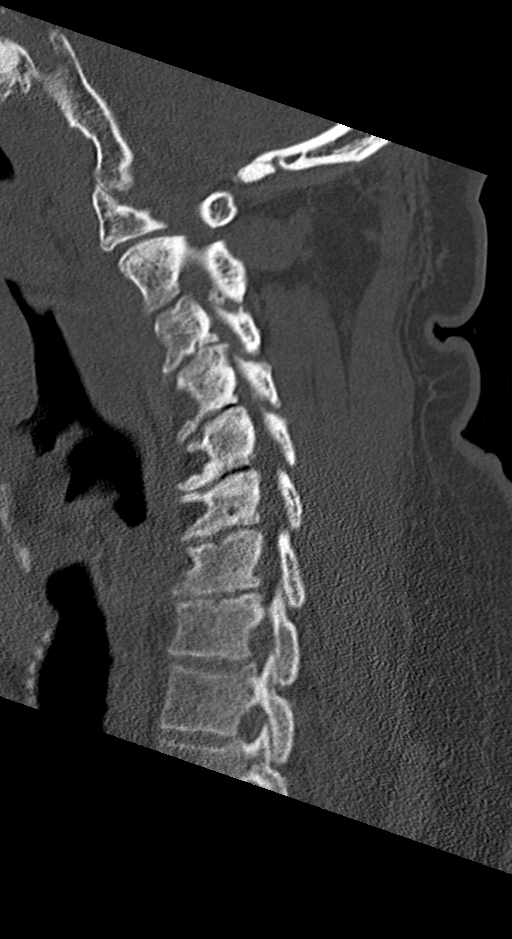
[im 41/61  bone]
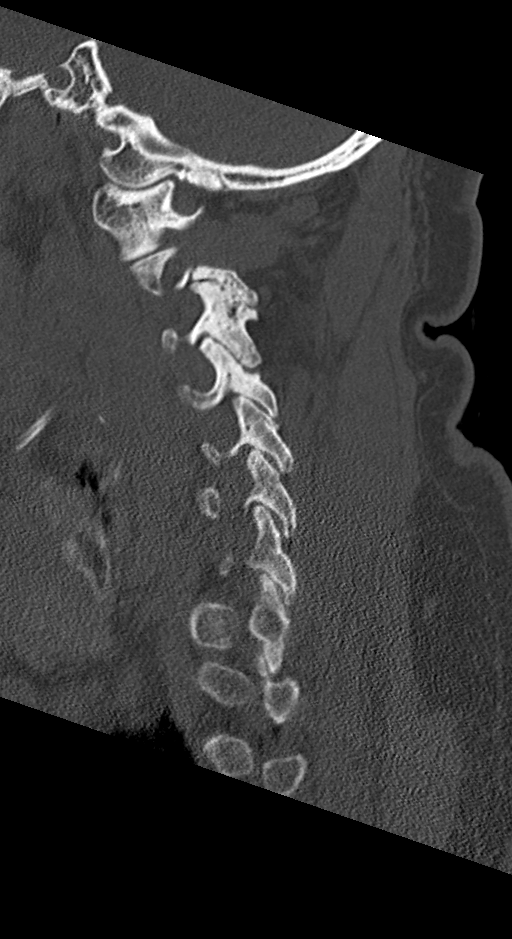

[Series 6: cor bone · coronal · 0.28mm/px · 3 of 57 slices shown]
[im 12/57  bone]
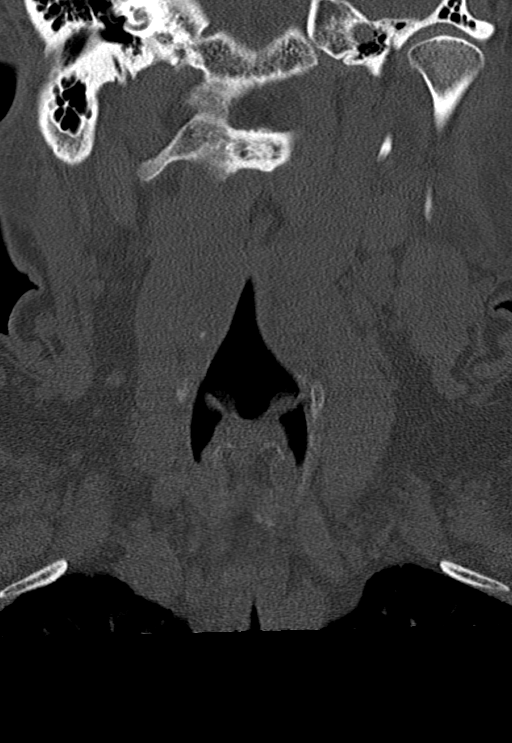
[im 23/57  bone]
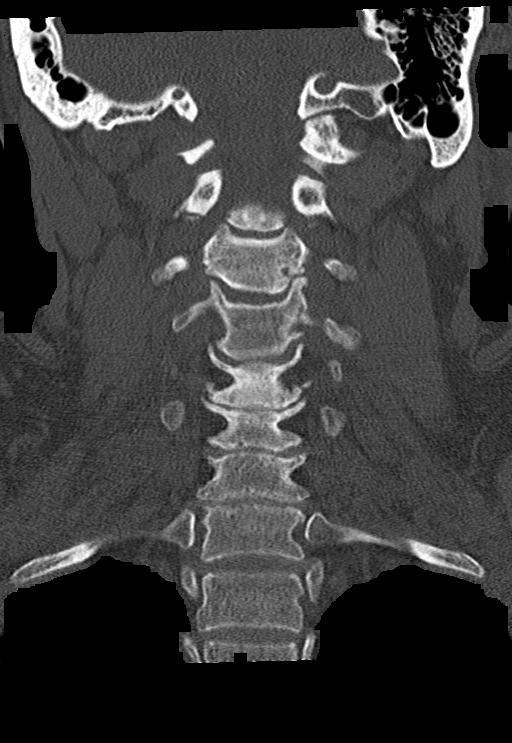
[im 34/57  bone]
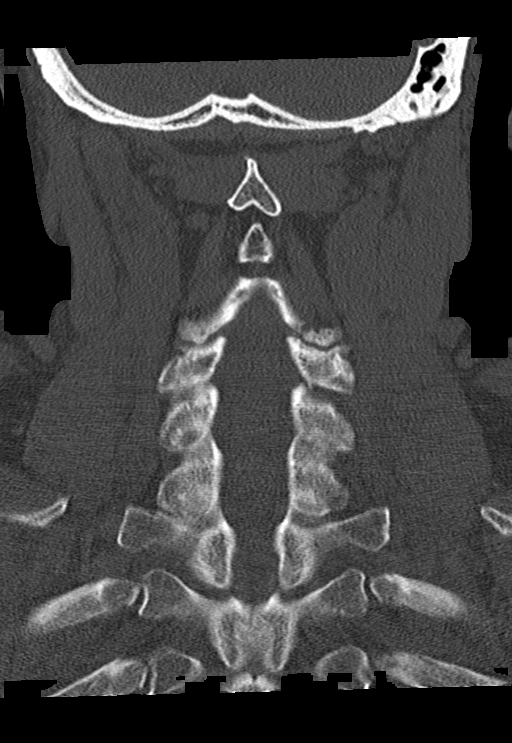

[Series 7: orthogonal axials · axial · 0.21mm/px · z∈[-127,-40]mm · 3 of 89 slices shown, 4 images]
[im 15/89  soft-tissue]
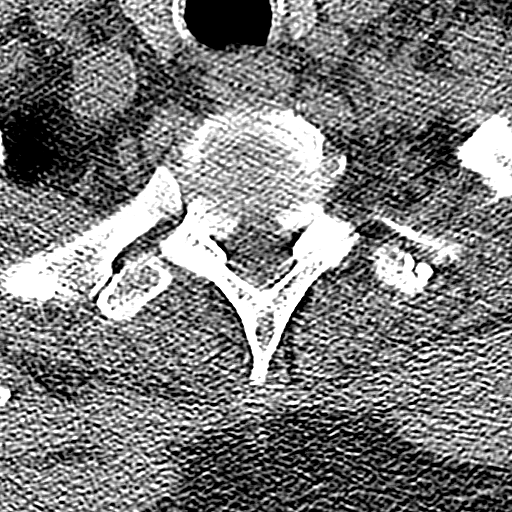
[im 15/89  bone]
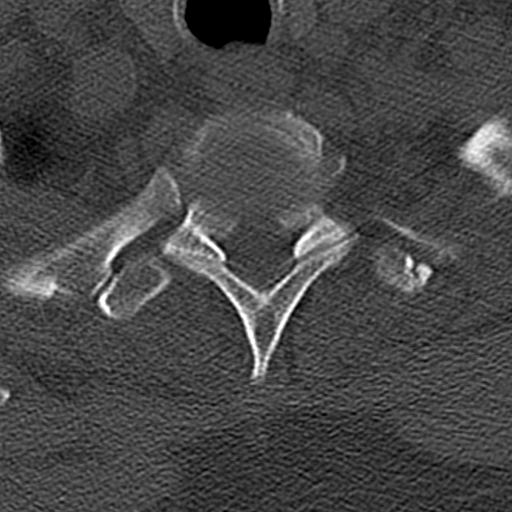
[im 45/89  bone]
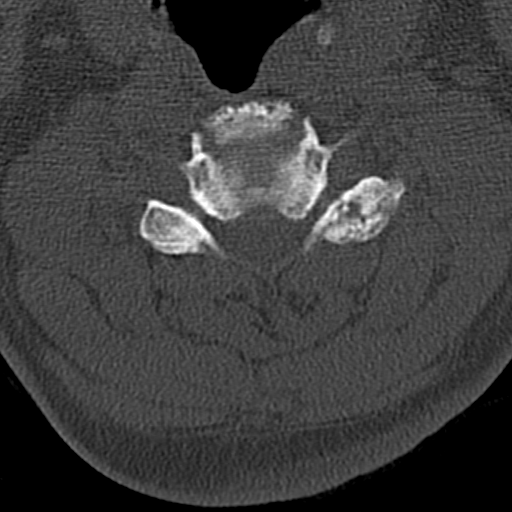
[im 74/89  bone]
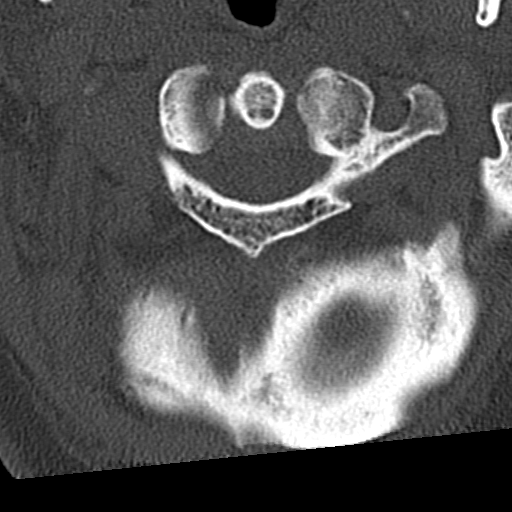

[11 of 33 positions shown; findings below may reference images not displayed]

FINDINGS: Alignment: Reversal of the normal cervical lordosis.  No listhesis.

Skull base and vertebrae: No acute fracture. No primary bone lesion
or focal pathologic process.

Soft tissues and spinal canal: No prevertebral fluid or swelling. No
visible canal hematoma.

Disc levels:

C2-C3: Small central disc protrusion. Severe right and mild left
facet arthropathy. No stenosis.

C3-C4: Small posterior disc osteophyte complex. Moderate bilateral
uncovertebral hypertrophy. Moderate right and mild left facet
arthropathy. Severe bilateral neuroforaminal stenosis. No spinal
canal stenosis.

C4-C5: Small posterior disc osteophyte complex. Moderate bilateral
uncovertebral hypertrophy. Moderate left and mild right facet
arthropathy. Moderate right and severe left neuroforaminal stenosis.

C5-C6: Small posterior disc osteophyte complex. Severe bilateral
uncovertebral hypertrophy. At most mild bilateral neuroforaminal
stenosis. No spinal canal stenosis.

C6-C7: Small posterior disc osteophyte complex and mild bilateral
uncovertebral hypertrophy. No stenosis.

C7-T1: Minimal disc bulging and mild circumferential endplate
spurring. No stenosis.

Upper chest: Negative.

Other: None.
IMPRESSION: 1. Advanced multilevel degenerative changes of the cervical spine,
as described above. Severe neuroforaminal stenosis bilaterally at
C3-C4 and on the left at C4-C5.
